# Patient Record
Sex: Female | Born: 1950 | Race: White | Hispanic: No | Marital: Married | Smoking: Never smoker
Health system: Southern US, Community
[De-identification: ages and names within clinical notes are randomized; demographics above are authoritative.]

## PROBLEM LIST (undated history)

## (undated) DIAGNOSIS — G4730 Sleep apnea, unspecified: Secondary | ICD-10-CM

## (undated) DIAGNOSIS — I10 Essential (primary) hypertension: Secondary | ICD-10-CM

## (undated) DIAGNOSIS — Z87442 Personal history of urinary calculi: Secondary | ICD-10-CM

## (undated) DIAGNOSIS — R011 Cardiac murmur, unspecified: Secondary | ICD-10-CM

## (undated) DIAGNOSIS — E119 Type 2 diabetes mellitus without complications: Secondary | ICD-10-CM

## (undated) DIAGNOSIS — N289 Disorder of kidney and ureter, unspecified: Secondary | ICD-10-CM

## (undated) HISTORY — PX: MENISCUS REPAIR: SHX5179

## (undated) HISTORY — PX: COLONOSCOPY: SHX174

## (undated) HISTORY — PX: BACK SURGERY: SHX140

---

## 2010-06-20 ENCOUNTER — Emergency Department (HOSPITAL_COMMUNITY)
Admission: EM | Admit: 2010-06-20 | Discharge: 2010-06-20 | Discharge status: Against Medical Advice | Payer: BC Managed Care – PPO | Attending: Emergency Medicine | Admitting: Emergency Medicine

## 2010-06-20 DIAGNOSIS — M79609 Pain in unspecified limb: Secondary | ICD-10-CM | POA: Insufficient documentation

## 2010-06-20 DIAGNOSIS — I10 Essential (primary) hypertension: Secondary | ICD-10-CM | POA: Insufficient documentation

## 2010-06-20 DIAGNOSIS — M545 Low back pain, unspecified: Secondary | ICD-10-CM | POA: Insufficient documentation

## 2010-06-20 DIAGNOSIS — Z79899 Other long term (current) drug therapy: Secondary | ICD-10-CM | POA: Insufficient documentation

## 2016-04-07 ENCOUNTER — Encounter (HOSPITAL_COMMUNITY): Payer: Self-pay | Admitting: Emergency Medicine

## 2016-04-07 ENCOUNTER — Inpatient Hospital Stay (HOSPITAL_COMMUNITY)
Admission: EM | Admit: 2016-04-07 | Discharge: 2016-04-10 | DRG: 872 | Disposition: A | Discharge status: Home / Self Care | Payer: BLUE CROSS/BLUE SHIELD | Attending: Urology | Admitting: Urology

## 2016-04-07 DIAGNOSIS — N10 Acute tubulo-interstitial nephritis: Secondary | ICD-10-CM

## 2016-04-07 DIAGNOSIS — I10 Essential (primary) hypertension: Secondary | ICD-10-CM | POA: Diagnosis present

## 2016-04-07 DIAGNOSIS — N136 Pyonephrosis: Secondary | ICD-10-CM | POA: Diagnosis present

## 2016-04-07 DIAGNOSIS — E119 Type 2 diabetes mellitus without complications: Secondary | ICD-10-CM | POA: Diagnosis present

## 2016-04-07 DIAGNOSIS — N201 Calculus of ureter: Secondary | ICD-10-CM | POA: Diagnosis not present

## 2016-04-07 DIAGNOSIS — Z7982 Long term (current) use of aspirin: Secondary | ICD-10-CM

## 2016-04-07 DIAGNOSIS — A419 Sepsis, unspecified organism: Secondary | ICD-10-CM | POA: Diagnosis not present

## 2016-04-07 DIAGNOSIS — Z7984 Long term (current) use of oral hypoglycemic drugs: Secondary | ICD-10-CM

## 2016-04-07 DIAGNOSIS — Z888 Allergy status to other drugs, medicaments and biological substances status: Secondary | ICD-10-CM

## 2016-04-07 HISTORY — DX: Disorder of kidney and ureter, unspecified: N28.9

## 2016-04-07 HISTORY — DX: Essential (primary) hypertension: I10

## 2016-04-07 HISTORY — DX: Type 2 diabetes mellitus without complications: E11.9

## 2016-04-07 LAB — URINALYSIS, ROUTINE W REFLEX MICROSCOPIC
Bilirubin Urine: NEGATIVE
GLUCOSE, UA: NEGATIVE mg/dL
KETONES UR: NEGATIVE mg/dL
NITRITE: POSITIVE — AB
Protein, ur: 30 mg/dL — AB
Specific Gravity, Urine: 1.012 (ref 1.005–1.030)
pH: 5 (ref 5.0–8.0)

## 2016-04-07 MED ORDER — SODIUM CHLORIDE 0.9 % IV BOLUS (SEPSIS)
1000.0000 mL | Freq: Once | INTRAVENOUS | Status: AC
  Administered 2016-04-07: 1000 mL via INTRAVENOUS

## 2016-04-07 MED ORDER — DEXTROSE 5 % IV SOLN
1.0000 g | Freq: Once | INTRAVENOUS | Status: AC
  Administered 2016-04-08: 1 g via INTRAVENOUS
  Filled 2016-04-07: qty 10

## 2016-04-07 MED ORDER — FENTANYL CITRATE (PF) 100 MCG/2ML IJ SOLN
50.0000 ug | Freq: Once | INTRAMUSCULAR | Status: AC
  Administered 2016-04-07: 50 ug via INTRAVENOUS
  Filled 2016-04-07: qty 2

## 2016-04-07 MED ORDER — ONDANSETRON HCL 4 MG/2ML IJ SOLN
4.0000 mg | Freq: Once | INTRAMUSCULAR | Status: AC
  Administered 2016-04-07: 4 mg via INTRAVENOUS
  Filled 2016-04-07: qty 2

## 2016-04-07 NOTE — ED Triage Notes (Signed)
Pt states that she has been having left flank pain since yesterday with chills and nausea.  Has a history of kidney stones

## 2016-04-07 NOTE — ED Provider Notes (Signed)
[facility]-EMER[facility]ENCY DEPT Provider Note   CSN: [id number] Arrival date & time: 04/07/16  1903 By signing my name below, I, Levon Hedger, attest that this documentation has been prepared under the direction and in the presence of Zadie Rhine, MD . Electronically Signed: Levon Hedger, Scribe. 04/07/2016. 11:27 PM.   History   Chief Complaint Chief Complaint  Patient presents with  . Flank Pain   HPI Isabella Stewart is a 66 y.o. female with a history of kidney stones, HTN and DM who presents to the Emergency Department complaining of constant, gradually worsening left lateral flank pain that does not radiate onset yesterday. She reports associated nausea and chills. Pain is exacerbated by direct palpation and with movement, and is alleviated by resting. Before symptoms began pt was otherwise in her normal health. Per pt, she was seen at [facility] by her PCP in January 2018 and was told that her kidney function had begun to change. Pt denies any fevers, vomiting, CP, SOB, diarrhea, extremity weakness or numbness, or any other associated symptoms.   The history is provided by the patient. No language interpreter was used.    Past Medical History:  Diagnosis Date  . Diabetes mellitus without complication (HCC)   . Hypertension   . Renal disorder    kidney stones    There are no active problems to display for this patient.   Past Surgical History:  Procedure Laterality Date  . BACK SURGERY      OB History    No data available       Home Medications    Prior to Admission medications   Medication Sig Start Date End Date Taking? Authorizing Provider  aspirin EC 81 MG tablet Take 81 mg by mouth daily.   Yes Historical Provider, MD  atorvastatin (LIPITOR) 10 MG tablet Take 10 mg by mouth at bedtime.   Yes Historical Provider, MD  chlorthalidone (HYGROTON) 25 MG tablet Take 25 mg by mouth daily.   Yes Historical Provider, MD  ergocalciferol (VITAMIN D2) 50000 units capsule Take 50,000  Units by mouth every Wednesday.   Yes Historical Provider, MD  fosinopril (MONOPRIL) 40 MG tablet Take 40 mg by mouth daily.   Yes Historical Provider, MD  glipiZIDE (GLUCOTROL XL) 10 MG 24 hr tablet Take 10 mg by mouth daily with breakfast.   Yes Historical Provider, MD  metFORMIN (GLUCOPHAGE) 500 MG tablet Take 500 mg by mouth 2 (two) times daily with a meal.   Yes Historical Provider, MD  multivitamin-lutein (OCUVITE-LUTEIN) CAPS capsule Take 1 capsule by mouth daily.   Yes Historical Provider, MD  pioglitazone (ACTOS) 30 MG tablet Take 30 mg by mouth daily.   Yes Historical Provider, MD    Family History No family history on file.  Social History Social History  Substance Use Topics  . Smoking status: Never Smoker  . Smokeless tobacco: Never Used  . Alcohol use No   Allergies   Premarin [conjugated estrogens]   Review of Systems Review of Systems  Constitutional: Positive for chills. Negative for fever.  Respiratory: Negative for shortness of breath.   Cardiovascular: Negative for chest pain.  Gastrointestinal: Positive for nausea. Negative for diarrhea and vomiting.  Genitourinary: Positive for flank pain.  Neurological: Negative for weakness and numbness.  All other systems reviewed and are negative.    Physical Exam Updated Vital Signs BP 135/72 (BP Location: Left Arm)   Pulse (!) 122   Temp 99.9 F (37.7 C) (Oral)   Resp 18  Ht 5\' 2"  (1.575 m)   Wt 180 lb (81.6 kg)   SpO2 100%   BMI 32.92 kg/m   Physical Exam CONSTITUTIONAL: Well developed/well nourished HEAD: Normocephalic/atraumatic EYES: EOMI/PERRL ENMT: Mucous membranes moist NECK: supple no meningeal signs SPINE/BACK:entire spine nontender CV: S1/S2 noted, no murmurs/rubs/gallops noted LUNGS: Lungs are clear to auscultation bilaterally, no apparent distress ABDOMEN: soft, nontender, no rebound or guarding, bowel sounds noted throughout abdomen ZO:XWRU cva tenderness NEURO: Pt is  awake/alert/appropriate, moves all extremitiesx4.  No facial droop.   EXTREMITIES: pulses normal/equal, full ROM SKIN: warm, color normal PSYCH: no abnormalities of mood noted, alert and oriented to situation   ED Treatments / Results  DIAGNOSTIC STUDIES:  Oxygen Saturation is 100% on RA, normal by my interpretation.    COORDINATION OF CARE:  11:18 PM Will order urine culture and CT renal stone study. Discussed treatment plan which includes Zofran and fentanyl with pt at bedside and pt agreed to plan.  Labs (all labs ordered are listed, but only abnormal results are displayed) Labs Reviewed  URINALYSIS, ROUTINE W REFLEX MICROSCOPIC - Abnormal; Notable for the following:       Result Value   APPearance HAZY (*)    Hgb urine dipstick LARGE (*)    Protein, ur 30 (*)    Nitrite POSITIVE (*)    Leukocytes, UA LARGE (*)    Bacteria, UA RARE (*)    Non Squamous Epithelial 0-5 (*)    All other components within normal limits  CBC WITH DIFFERENTIAL/PLATELET - Abnormal; Notable for the following:    RBC 3.62 (*)    Hemoglobin 10.8 (*)    HCT 32.1 (*)    All other components within normal limits  BASIC METABOLIC PANEL - Abnormal; Notable for the following:    Sodium 131 (*)    Chloride 95 (*)    Glucose, Bld 213 (*)    All other components within normal limits  URINE CULTURE    EKG  EKG Interpretation None       Radiology Ct Renal Stone Study  Result Date: 04/08/2016 CLINICAL DATA:  66 year old female with left flank pain. EXAM: CT ABDOMEN AND PELVIS WITHOUT CONTRAST TECHNIQUE: Multidetector CT imaging of the abdomen and pelvis was performed following the standard protocol without IV contrast. COMPARISON:  None. FINDINGS: Evaluation of this exam is limited in the absence of intravenous contrast. Lower chest: The visualized lung bases are clear. No intra-abdominal free air or free fluid. Hepatobiliary: No focal liver abnormality is seen. No gallstones, gallbladder wall  thickening, or biliary dilatation. Pancreas: Unremarkable. No pancreatic ductal dilatation or surrounding inflammatory changes. Spleen: Normal in size without focal abnormality. Adrenals/Urinary Tract: The adrenal glands are unremarkable. There is a 5 mm distal left ureteral stone with mild left hydronephrosis. Correlation with urinalysis recommended to exclude superimposed UTI. The right kidney, right ureter, and urinary bladder appear unremarkable. Stomach/Bowel: There is sigmoid diverticulosis without active inflammatory changes. Constipation. No bowel obstruction or active inflammation. Normal appendix. A 12 mm distal duodenal diverticulum noted. Vascular/Lymphatic: Mild aortoiliac atherosclerotic disease. No aneurysmal dilatation. The IVC is grossly unremarkable. No portal venous gas identified. Evaluation of the vasculature is limited in the absence of intravenous contrast. There is no adenopathy. Reproductive: The uterus and ovaries are grossly unremarkable. Other: None Musculoskeletal: Degenerative changes of the spine. L4-L5 disc desiccation with vacuum phenomenon. L4-L5 facet arthropathy and vacuum phenomena. No acute fracture. IMPRESSION: 1. A 5 mm distal left ureteral stone with mild left hydronephrosis. 2. Constipation. No  bowel obstruction or active inflammation. Normal appendix. 3. Sigmoid diverticulosis. Electronically Signed   By: Elgie CollardArash  Radparvar M.D.   On: 04/08/2016 01:35    Procedures Procedures  Medications Ordered in ED Medications  ondansetron [facility](ZOFRAN) injection 4 mg (4 mg Intravenous Given 04/07/16 2343)  fentaNYL (SUBLIMAZE) injection 50 mcg (50 mcg Intravenous Given 04/07/16 2342)  sodium chloride 0.9 % bolus 1,000 mL (0 mLs Intravenous Stopped 04/08/16 0209)  cefTRIAXone (ROCEPHIN) 1 g in dextrose 5 % 50 mL IVPB (0 g Intravenous Stopped 04/08/16 0209)     Initial Impression / Assessment and Plan / ED Course  I have reviewed the triage vital signs and the nursing  notes.  Pertinent labs  results that were available during my care of the patient were reviewed by me and considered in my medical decision making (see chart for details).     2:47 AM Pt noted to have left ureteral stone with associated UTI D/w dr urology dr Ronne Binningmckenzie [facility] and consult him when she arrives Pt agreeable She has been given IV antibiotics I have informed Elpidio AnisShari Upstill PA  To inform Dr Devoria AlbeIva Knapp about patient transfer  BP 111/70 (BP Location: Left Arm)   Pulse 91   Temp 98.6 F (37 C) (Oral)   Resp 15   Ht 5\' 2"  (1.575 m)   Wt 81.6 kg   SpO2 96%   BMI 32.92 kg/m   Final Clinical Impressions(s) / ED Diagnoses   Final diagnoses:  Acute pyelonephritis  Ureteral stone    New Prescriptions New Prescriptions   No medications on file   I personally performed the services described in this documentation, which was scribed in my presence. The recorded information has been reviewed and is accurate.        Zadie Rhineonald Britten Parady, MD 04/08/16 [phone]

## 2016-04-08 ENCOUNTER — Observation Stay (HOSPITAL_COMMUNITY): Payer: BLUE CROSS/BLUE SHIELD

## 2016-04-08 ENCOUNTER — Emergency Department (HOSPITAL_COMMUNITY): Payer: BLUE CROSS/BLUE SHIELD | Admitting: Certified Registered"

## 2016-04-08 ENCOUNTER — Encounter (HOSPITAL_COMMUNITY): Payer: Self-pay | Admitting: Certified Registered"

## 2016-04-08 ENCOUNTER — Emergency Department (HOSPITAL_COMMUNITY): Payer: BLUE CROSS/BLUE SHIELD

## 2016-04-08 ENCOUNTER — Encounter (HOSPITAL_COMMUNITY)
Admission: EM | Disposition: A | Discharge status: Home / Self Care | Payer: Self-pay | Source: Home / Self Care | Attending: Urology

## 2016-04-08 DIAGNOSIS — N136 Pyonephrosis: Secondary | ICD-10-CM | POA: Diagnosis not present

## 2016-04-08 DIAGNOSIS — N201 Calculus of ureter: Secondary | ICD-10-CM | POA: Diagnosis present

## 2016-04-08 DIAGNOSIS — I10 Essential (primary) hypertension: Secondary | ICD-10-CM | POA: Diagnosis not present

## 2016-04-08 DIAGNOSIS — A419 Sepsis, unspecified organism: Secondary | ICD-10-CM | POA: Diagnosis not present

## 2016-04-08 DIAGNOSIS — Z7982 Long term (current) use of aspirin: Secondary | ICD-10-CM | POA: Diagnosis not present

## 2016-04-08 DIAGNOSIS — Z7984 Long term (current) use of oral hypoglycemic drugs: Secondary | ICD-10-CM | POA: Diagnosis not present

## 2016-04-08 DIAGNOSIS — E119 Type 2 diabetes mellitus without complications: Secondary | ICD-10-CM | POA: Diagnosis not present

## 2016-04-08 DIAGNOSIS — Z888 Allergy status to other drugs, medicaments and biological substances status: Secondary | ICD-10-CM | POA: Diagnosis not present

## 2016-04-08 HISTORY — PX: CYSTOSCOPY W/ URETERAL STENT PLACEMENT: SHX1429

## 2016-04-08 LAB — CBC WITH DIFFERENTIAL/PLATELET
BASOS ABS: 0 10*3/uL (ref 0.0–0.1)
BASOS PCT: 0 %
Eosinophils Absolute: 0 10*3/uL (ref 0.0–0.7)
Eosinophils Relative: 0 %
HEMATOCRIT: 32.1 % — AB (ref 36.0–46.0)
HEMOGLOBIN: 10.8 g/dL — AB (ref 12.0–15.0)
LYMPHS PCT: 11 %
Lymphs Abs: 1.1 10*3/uL (ref 0.7–4.0)
MCH: 29.8 pg (ref 26.0–34.0)
MCHC: 33.6 g/dL (ref 30.0–36.0)
MCV: 88.7 fL (ref 78.0–100.0)
MONO ABS: 0.9 10*3/uL (ref 0.1–1.0)
Monocytes Relative: 9 %
NEUTROS ABS: 7.6 10*3/uL (ref 1.7–7.7)
NEUTROS PCT: 80 %
Platelets: 229 10*3/uL (ref 150–400)
RBC: 3.62 MIL/uL — AB (ref 3.87–5.11)
RDW: 14.7 % (ref 11.5–15.5)
WBC: 9.6 10*3/uL (ref 4.0–10.5)

## 2016-04-08 LAB — BASIC METABOLIC PANEL
ANION GAP: 8 (ref 5–15)
ANION GAP: 9 (ref 5–15)
BUN: 13 mg/dL (ref 6–20)
BUN: 13 mg/dL (ref 6–20)
CALCIUM: 9.4 mg/dL (ref 8.9–10.3)
CO2: 29 mmol/L (ref 22–32)
CO2: 27 mmol/L (ref 22–32)
Calcium: 8.8 mg/dL — ABNORMAL LOW (ref 8.9–10.3)
Chloride: 95 mmol/L — ABNORMAL LOW (ref 101–111)
Chloride: 98 mmol/L — ABNORMAL LOW (ref 101–111)
Creatinine, Ser: 0.83 mg/dL (ref 0.44–1.00)
Creatinine, Ser: 0.92 mg/dL (ref 0.44–1.00)
GFR calc Af Amer: >60 mL/min (ref >60)
GFR calc non Af Amer: >60 mL/min (ref >60)
GLUCOSE: 158 mg/dL — AB (ref 65–99)
GLUCOSE: 213 mg/dL — AB (ref 65–99)
POTASSIUM: 3.6 mmol/L (ref 3.5–5.1)
Potassium: 3.7 mmol/L (ref 3.5–5.1)
Sodium: 131 mmol/L — ABNORMAL LOW (ref 135–145)
Sodium: 135 mmol/L (ref 135–145)

## 2016-04-08 LAB — GLUCOSE, CAPILLARY
GLUCOSE-CAPILLARY: 127 mg/dL — AB (ref 65–99)
GLUCOSE-CAPILLARY: 157 mg/dL — AB (ref 65–99)
GLUCOSE-CAPILLARY: 109 mg/dL — AB (ref 65–99)
Glucose-Capillary: 150 mg/dL — ABNORMAL HIGH (ref 65–99)
Glucose-Capillary: 136 mg/dL — ABNORMAL HIGH (ref 65–99)

## 2016-04-08 LAB — CBC
HEMATOCRIT: 27.5 % — AB (ref 36.0–46.0)
Hemoglobin: 9.1 g/dL — ABNORMAL LOW (ref 12.0–15.0)
MCH: 28.8 pg (ref 26.0–34.0)
MCHC: 33.1 g/dL (ref 30.0–36.0)
MCV: 87 fL (ref 78.0–100.0)
PLATELETS: 183 10*3/uL (ref 150–400)
RBC: 3.16 MIL/uL — ABNORMAL LOW (ref 3.87–5.11)
RDW: 15 % (ref 11.5–15.5)
WBC: 7.1 10*3/uL (ref 4.0–10.5)

## 2016-04-08 SURGERY — CYSTOSCOPY, WITH RETROGRADE PYELOGRAM AND URETERAL STENT INSERTION
Anesthesia: General | Site: Ureter | Laterality: Left

## 2016-04-08 MED ORDER — FENTANYL CITRATE (PF) 250 MCG/5ML IJ SOLN
INTRAMUSCULAR | Status: DC | PRN
  Administered 2016-04-08: 50 ug via INTRAVENOUS

## 2016-04-08 MED ORDER — DEXTROSE 5 % IV SOLN
1.0000 g | Freq: Once | INTRAVENOUS | Status: AC
  Administered 2016-04-08: 1 g via INTRAVENOUS
  Filled 2016-04-08: qty 10

## 2016-04-08 MED ORDER — ONDANSETRON HCL 4 MG/2ML IJ SOLN
INTRAMUSCULAR | Status: DC | PRN
  Administered 2016-04-08: 4 mg via INTRAVENOUS

## 2016-04-08 MED ORDER — ASPIRIN EC 81 MG PO TBEC
81.0000 mg | DELAYED_RELEASE_TABLET | Freq: Every day | ORAL | Status: DC
  Administered 2016-04-08 – 2016-04-10 (×3): 81 mg via ORAL
  Filled 2016-04-08 (×3): qty 1

## 2016-04-08 MED ORDER — PHENYLEPHRINE HCL 10 MG/ML IJ SOLN
INTRAMUSCULAR | Status: DC | PRN
  Administered 2016-04-08: 80 ug via INTRAVENOUS

## 2016-04-08 MED ORDER — ACETAMINOPHEN 325 MG PO TABS
650.0000 mg | ORAL_TABLET | ORAL | Status: DC | PRN
  Administered 2016-04-08 – 2016-04-09 (×3): 650 mg via ORAL
  Filled 2016-04-08 (×3): qty 2

## 2016-04-08 MED ORDER — OXYCODONE HCL 5 MG/5ML PO SOLN
5.0000 mg | Freq: Once | ORAL | Status: DC | PRN
  Filled 2016-04-08: qty 5

## 2016-04-08 MED ORDER — PROPOFOL 10 MG/ML IV BOLUS
INTRAVENOUS | Status: DC | PRN
  Administered 2016-04-08: 180 mg via INTRAVENOUS

## 2016-04-08 MED ORDER — FENTANYL CITRATE (PF) 100 MCG/2ML IJ SOLN
INTRAMUSCULAR | Status: AC
  Filled 2016-04-08: qty 2

## 2016-04-08 MED ORDER — CHLORTHALIDONE 25 MG PO TABS
25.0000 mg | ORAL_TABLET | Freq: Every day | ORAL | Status: DC
Start: 2016-04-08 — End: 2016-04-10
  Administered 2016-04-08 – 2016-04-10 (×3): 25 mg via ORAL
  Filled 2016-04-08 (×4): qty 1

## 2016-04-08 MED ORDER — DIPHENHYDRAMINE HCL 50 MG/ML IJ SOLN: 12.5000 mg | Freq: Four times a day (QID) | INTRAMUSCULAR | Status: DC | PRN

## 2016-04-08 MED ORDER — ZOLPIDEM TARTRATE 5 MG PO TABS: 5.0000 mg | ORAL_TABLET | Freq: Every evening | ORAL | Status: DC | PRN

## 2016-04-08 MED ORDER — DEXTROSE 5 % IV SOLN
2.0000 g | INTRAVENOUS | Status: DC
  Administered 2016-04-09 – 2016-04-10 (×2): 2 g via INTRAVENOUS
  Filled 2016-04-08 (×2): qty 2

## 2016-04-08 MED ORDER — FENTANYL CITRATE (PF) 100 MCG/2ML IJ SOLN
25.0000 ug | INTRAMUSCULAR | Status: DC | PRN
  Administered 2016-04-08: 50 ug via INTRAVENOUS

## 2016-04-08 MED ORDER — HYDROMORPHONE HCL 1 MG/ML IJ SOLN
0.5000 mg | INTRAMUSCULAR | Status: DC | PRN
  Administered 2016-04-08 (×2): 0.5 mg via INTRAVENOUS
  Administered 2016-04-08: 1 mg via INTRAVENOUS
  Administered 2016-04-08: 0.5 mg via INTRAVENOUS
  Administered 2016-04-09 (×2): 1 mg via INTRAVENOUS
  Administered 2016-04-09 – 2016-04-10 (×4): 0.5 mg via INTRAVENOUS
  Filled 2016-04-08 (×3): qty 0.5
  Filled 2016-04-08: qty 1
  Filled 2016-04-08 (×3): qty 0.5
  Filled 2016-04-08 (×2): qty 1
  Filled 2016-04-08: qty 0.5
  Filled 2016-04-08: qty 1

## 2016-04-08 MED ORDER — MIDAZOLAM HCL 2 MG/2ML IJ SOLN
INTRAMUSCULAR | Status: AC
Start: 2016-04-08 — End: 2016-04-08
  Filled 2016-04-08: qty 2

## 2016-04-08 MED ORDER — FENTANYL CITRATE (PF) 100 MCG/2ML IJ SOLN
50.0000 ug | Freq: Once | INTRAMUSCULAR | Status: AC
  Administered 2016-04-08: 50 ug via INTRAVENOUS
  Filled 2016-04-08: qty 2

## 2016-04-08 MED ORDER — DOCUSATE SODIUM 100 MG PO CAPS
100.0000 mg | ORAL_CAPSULE | Freq: Two times a day (BID) | ORAL | Status: DC
  Administered 2016-04-08 – 2016-04-10 (×5): 100 mg via ORAL
  Filled 2016-04-08 (×5): qty 1

## 2016-04-08 MED ORDER — DIPHENHYDRAMINE HCL 12.5 MG/5ML PO ELIX: 12.5000 mg | ORAL_SOLUTION | Freq: Four times a day (QID) | ORAL | Status: DC | PRN

## 2016-04-08 MED ORDER — LACTATED RINGERS IV SOLN: INTRAVENOUS | Status: DC

## 2016-04-08 MED ORDER — ONDANSETRON HCL 4 MG/2ML IJ SOLN: 4.0000 mg | Freq: Four times a day (QID) | INTRAMUSCULAR | Status: DC | PRN

## 2016-04-08 MED ORDER — INSULIN ASPART 100 UNIT/ML ~~LOC~~ SOLN
0.0000 [IU] | Freq: Three times a day (TID) | SUBCUTANEOUS | Status: DC
  Administered 2016-04-08 (×2): 2 [IU] via SUBCUTANEOUS
  Administered 2016-04-09: 3 [IU] via SUBCUTANEOUS
  Administered 2016-04-10: 2 [IU] via SUBCUTANEOUS

## 2016-04-08 MED ORDER — ONDANSETRON HCL 4 MG/2ML IJ SOLN
4.0000 mg | INTRAMUSCULAR | Status: DC | PRN
  Administered 2016-04-08 – 2016-04-09 (×2): 4 mg via INTRAVENOUS
  Filled 2016-04-08 (×2): qty 2

## 2016-04-08 MED ORDER — MIDAZOLAM HCL 2 MG/2ML IJ SOLN
INTRAMUSCULAR | Status: DC | PRN
  Administered 2016-04-08: 2 mg via INTRAVENOUS

## 2016-04-08 MED ORDER — LACTATED RINGERS IV SOLN
INTRAVENOUS | Status: DC | PRN
  Administered 2016-04-08: 06:00:00 via INTRAVENOUS

## 2016-04-08 MED ORDER — SODIUM CHLORIDE 0.9 % IV SOLN
INTRAVENOUS | Status: DC | PRN
  Administered 2016-04-08: 07:00:00

## 2016-04-08 MED ORDER — ATORVASTATIN CALCIUM 10 MG PO TABS
10.0000 mg | ORAL_TABLET | Freq: Every day | ORAL | Status: DC
  Administered 2016-04-08 – 2016-04-09 (×2): 10 mg via ORAL
  Filled 2016-04-08 (×2): qty 1

## 2016-04-08 MED ORDER — OXYCODONE-ACETAMINOPHEN 5-325 MG PO TABS: 1.0000 | ORAL_TABLET | ORAL | Status: DC | PRN

## 2016-04-08 MED ORDER — OXYCODONE HCL 5 MG PO TABS: 5.0000 mg | ORAL_TABLET | Freq: Once | ORAL | Status: DC | PRN

## 2016-04-08 MED ORDER — LIDOCAINE 2% (20 MG/ML) 5 ML SYRINGE
INTRAMUSCULAR | Status: DC | PRN
  Administered 2016-04-08: 100 mg via INTRAVENOUS

## 2016-04-08 MED ORDER — SODIUM CHLORIDE 0.9 % IV SOLN
INTRAVENOUS | Status: DC
  Administered 2016-04-08 – 2016-04-09 (×4): via INTRAVENOUS

## 2016-04-08 MED ORDER — PROPOFOL 10 MG/ML IV BOLUS
INTRAVENOUS | Status: AC
  Filled 2016-04-08: qty 20

## 2016-04-08 SURGICAL SUPPLY — 25 items
BAG URO CATCHER STRL LF (MISCELLANEOUS) ×3 IMPLANT
BASKET DAKOTA 1.9FR 11X120 (BASKET) IMPLANT
BASKET LASER NITINOL 1.9FR (BASKET) IMPLANT
CATH FOLEY LATEX FREE 20FR (CATHETERS)
CATH FOLEY LF 20FR (CATHETERS) IMPLANT
CATH INTERMIT  6FR 70CM (CATHETERS) ×3 IMPLANT
CLOTH BEACON ORANGE TIMEOUT ST (SAFETY) ×3 IMPLANT
EXTRACTOR STONE NITINOL NGAGE (UROLOGICAL SUPPLIES) IMPLANT
FIBER LASER TRAC TIP (UROLOGICAL SUPPLIES) IMPLANT
GLOVE BIO SURGEON STRL SZ8 (GLOVE) ×3 IMPLANT
GOWN STRL REUS W/TWL XL LVL3 (GOWN DISPOSABLE) ×3 IMPLANT
GUIDEWIRE ANG ZIPWIRE 038X150 (WIRE) ×3 IMPLANT
GUIDEWIRE STR DUAL SENSOR (WIRE) ×3 IMPLANT
IV NS 1000ML (IV SOLUTION) ×2
IV NS 1000ML BAXH (IV SOLUTION) ×1 IMPLANT
MANIFOLD NEPTUNE II (INSTRUMENTS) ×3 IMPLANT
PACK CYSTO (CUSTOM PROCEDURE TRAY) ×3 IMPLANT
SHEATH ACCESS URETERAL 38CM (SHEATH) IMPLANT
STENT CONTOUR 6FRX26X.038 (STENTS) IMPLANT
STENT URET 6FRX24 CONTOUR (STENTS) ×3 IMPLANT
SYR CONTROL 10ML LL (SYRINGE) IMPLANT
SYRINGE IRR TOOMEY STRL 70CC (SYRINGE) IMPLANT
TUBE FEEDING 8FR 16IN STR KANG (MISCELLANEOUS) IMPLANT
TUBING CONNECTING 10 (TUBING) ×2 IMPLANT
TUBING CONNECTING 10' (TUBING) ×1

## 2016-04-08 NOTE — Progress Notes (Signed)
CBC and B met drawn by lab. 

## 2016-04-08 NOTE — ED Notes (Signed)
Called OR [facility] [phone]

## 2016-04-08 NOTE — H&P (Signed)
Urology Admission H&P  Chief Complaint: left flank pain  History of Present Illness: Ms Isabella Stewart is a 66yo with a hx of nephrolithiasis and DMII who presented to [facility] ER with a 1 day hx of svere sharp, constant, nonraditiang left flank pain. She was having chills at home. She has associated nausea and vomiting. She has 1-2 stone events a year. She had a septic stone event in 1999. WBC count was normal. Ct scan showed a 5mm left proximal ureteral calculus with mild hydronephrosis and perinephric stranding. She had a fever of 100F in the ER.   Past Medical History:  Diagnosis Date  . Diabetes mellitus without complication (HCC)   . Hypertension   . Renal disorder    kidney stones   Past Surgical History:  Procedure Laterality Date  . BACK SURGERY      Home Medications:   (Not in a hospital admission) Allergies:  Allergies  Allergen Reactions  . Premarin [Conjugated Estrogens] Rash    History reviewed. No pertinent family history. Social History:  reports that she has never smoked. She has never used smokeless tobacco. She reports that she does not drink alcohol or use drugs.  Review of Systems  Constitutional: Positive for chills and fever.  Gastrointestinal: Positive for nausea and vomiting.  Genitourinary: Positive for dysuria, flank pain, frequency, hematuria and urgency.  All other systems reviewed and are negative.   Physical Exam:  Vital signs in last 24 hours: Temp:  [98.6 F (37 C)-99.9 F (37.7 C)] 98.6 F (37 C) (03/23 0349) Pulse Rate:  [91-122] 98 (03/23 0349) Resp:  [15-18] 16 (03/23 0349) BP: (105-135)/(62-72) 105/62 (03/23 0349) SpO2:  [96 %-100 %] 97 % (03/23 0434) Weight:  [81.6 kg (180 lb)] 81.6 kg (180 lb) (03/22 1934) Physical Exam  Constitutional: She is oriented to person, place, and time. She appears well-developed and well-nourished.  HENT:  Head: Normocephalic and atraumatic.  Eyes: EOM are normal. Pupils are equal, round, and reactive to  light.  Neck: Normal range of motion. No thyromegaly present.  Cardiovascular: Normal rate and regular rhythm.   Respiratory: Effort normal. No respiratory distress.  GI: Soft. She exhibits no distension.  Musculoskeletal: Normal range of motion. She exhibits no edema.  Neurological: She is alert and oriented to person, place, and time.  Skin: Skin is warm and dry.  Psychiatric: She has a normal mood and affect. Her behavior is normal. Judgment and thought content normal.    Laboratory Data:  Results for orders placed or performed during the hospital encounter of 04/07/16 (from the past 24 hour(s))  Urinalysis, Routine w reflex microscopic     Status: Abnormal   Collection Time: 04/07/16 11:06 PM  Result Value Ref Range   Color, Urine YELLOW YELLOW   APPearance HAZY (A) CLEAR   Specific Gravity, Urine 1.012 1.005 - 1.030   pH 5.0 5.0 - 8.0   Glucose, UA NEGATIVE NEGATIVE mg/dL   Hgb urine dipstick LARGE (A) NEGATIVE   Bilirubin Urine NEGATIVE NEGATIVE   Ketones, ur NEGATIVE NEGATIVE mg/dL   Protein, ur 30 (A) NEGATIVE mg/dL   Nitrite POSITIVE (A) NEGATIVE   Leukocytes, UA LARGE (A) NEGATIVE   RBC / HPF TOO NUMEROUS TO COUNT 0 - 5 RBC/hpf   WBC, UA TOO NUMEROUS TO COUNT 0 - 5 WBC/hpf   Bacteria, UA RARE (A) NONE SEEN   Mucous PRESENT    Non Squamous Epithelial 0-5 (A) NONE SEEN  CBC with Differential/Platelet     Status:  Abnormal   Collection Time: 04/07/16 11:34 PM  Result Value Ref Range   WBC 9.6 4.0 - 10.5 K/uL   RBC 3.62 (L) 3.87 - 5.11 MIL/uL   Hemoglobin 10.8 (L) 12.0 - 15.0 g/dL   HCT 32.1 (L) 36.0 - 46.0 %   MCV 88.7 78.0 - 100.0 fL   MCH 29.8 26.0 - 34.0 pg   MCHC 33.6 30.0 - 36.0 g/dL   RDW 14.7 11.5 - 15.5 %   Platelets 229 150 - 400 K/uL   Neutrophils Relative % 80 %   Neutro Abs 7.6 1.7 - 7.7 K/uL   Lymphocytes Relative 11 %   Lymphs Abs 1.1 0.7 - 4.0 K/uL   Monocytes Relative 9 %   Monocytes Absolute 0.9 0.1 - 1.0 K/uL   Eosinophils Relative 0 %    Eosinophils Absolute 0.0 0.0 - 0.7 K/uL   Basophils Relative 0 %   Basophils Absolute 0.0 0.0 - 0.1 K/uL  Basic metabolic panel     Status: Abnormal   Collection Time: 04/07/16 11:34 PM  Result Value Ref Range   Sodium 131 (L) 135 - 145 mmol/L   Potassium 3.6 3.5 - 5.1 mmol/L   Chloride 95 (L) 101 - 111 mmol/L   CO2 27 22 - 32 mmol/L   Glucose, Bld 213 (H) 65 - 99 mg/dL   BUN 13 6 - 20 mg/dL   Creatinine, Ser 0.83 0.44 - 1.00 mg/dL   Calcium 9.4 8.9 - 10.3 mg/dL   GFR calc non Af Amer >60 >60 mL/min   GFR calc Af Amer >60 >60 mL/min   Anion gap 9 5 - 15   No results found for this or any previous visit (from the past 240 hour(s)). Creatinine:  Recent Labs  04/07/16 2334  CREATININE 0.83   Baseline Creatinine: 0.8  Impression/Assessment:  66yo with left ureteral calculus and sepsis  Plan:  1. The risks/benefits/alternatives to left ureteral stent placement was explained to the patient and she understands and wishes to proceed with surgery 2. She will be taken urgently to the OR for stent placement and then will be admitted for IV antibiotics post op.  Wildon Cuevas 04/08/2016, 5:51 AM      

## 2016-04-08 NOTE — Op Note (Signed)
.  Preoperative diagnosis: Left ureteral stone, sepsis  Postoperative diagnosis: Same  Procedure: 1 cystoscopy 2. Left retrograde pyelography 3.  Intraoperative fluoroscopy, under one hour, with interpretation 4. Left 6 x 26 JJ stent placement  Attending: Wilkie AyePatrick Tria Noguera  Anesthesia: General  Estimated blood loss: None  Drains: Left 6 x 24 JJ ureteral stent without tether, 16 French foley catheter  Specimens: none  Antibiotics: rocephin  Findings: left mid ureteral stone. Mild hydronephrosis. No masses/lesions in the bladder. Ureteral orifices in normal anatomic location.  Indications: Patient is a 66 year old female with a history of left renal stone and concern for sepsis.  After discussing treatment options, they decided proceed with left stent placement.  Procedure her in detail: The patient was brought to the operating room and a brief timeout was done to ensure correct patient, correct procedure, correct site.  General anesthesia was administered patient was placed in dorsal lithotomy position.  Their genitalia was then prepped and draped in usual sterile fashion.  A rigid 22 French cystoscope was passed in the urethra and the bladder.  Bladder was inspected free masses or lesions.  the ureteral orifices were in the normal orthotopic locations.  a 6 french ureteral catheter was then instilled into the left ureteral orifice.  a gentle retrograde was obtained and findings noted above.  we then placed a zip wire through the ureteral catheter and advanced up to the renal pelvis.    We then placed a 6 x 24 double-j ureteral stent over the original zip wire.  We then removed the wire and good coil was noted in the the renal pelvis under fluoroscopy and the bladder under direct vision.  A foley catheter was then placed. the bladder was then drained and this concluded the procedure which was well tolerated by patient.  Complications: None  Condition: Stable, extubated, transferred to  PACU  Plan: Patient is to be admitted for IV antibiotics. She will have his stone extraction in 2 weeks.

## 2016-04-08 NOTE — Anesthesia Postprocedure Evaluation (Addendum)
Anesthesia Post Note  Patient: Isabella Stewart  Procedure(s) Performed: Procedure(s) (LRB): CYSTOSCOPY WITH RETROGRADE PYELOGRAM/URETERAL STENT PLACEMENT (Left)  Patient location during evaluation: PACU Anesthesia Type: General Level of consciousness: sedated Pain management: pain level controlled Vital Signs Assessment: post-procedure vital signs reviewed and stable Respiratory status: spontaneous breathing and respiratory function stable Cardiovascular status: stable Anesthetic complications: no       Last Vitals:  Vitals:   04/08/16 0749 04/08/16 0800  BP:  101/67  Pulse: 85 93  Resp: 18 14  Temp:  37.8 C    Last Pain:  Vitals:   04/08/16 0749  TempSrc:   PainSc: 6                  SINGER,JAMES DANIEL

## 2016-04-08 NOTE — ED Provider Notes (Signed)
Patient arrived in transfer [facility] where she was fully evaluated and diagnosed with kidney stone in the setting of infection. Dr. Bebe ShaggyWickline spoke to urology, Dr. Ronne BinningMcKenzie, who [facility].   On arrival the patient is comfortable. VSS. She expresses no needs.  Discussed with Dr. Ronne BinningMcKenzie who will see the patient as soon as possible.      Elpidio AnisShari Allen Basista, PA-C 04/08/16 [phone]    Devoria AlbeIva Knapp, MD 04/08/16 [phone]

## 2016-04-08 NOTE — Care Management Note (Signed)
Case Management Note  Patient Details  Name: Isabella Stewart MRN: [id number] Date of Birth: [birth year]  Subjective/Objective:  Pt admitted with Ureteral calculus                  Action/Plan: Plan to discharge home with no needs at present time.    Expected Discharge Date:    04/11/16              Expected Discharge Plan:  Home/Self Care  In-House Referral:  NA  Discharge planning Services  CM Consult  Post Acute Care Choice:  NA Choice offered to:  NA  DME Arranged:  N/A DME Agency:  NA  HH Arranged:  NA HH Agency:  NA  Status of Service:  In process, will continue to follow  If discussed at Long Length of Stay Meetings, dates discussed:    Additional CommentsGeni Bers:  [name], RN 04/08/2016, 3:53 PM

## 2016-04-08 NOTE — ED Notes (Signed)
Notified paul OR pt is a type 2 diabetic and last meal was breakfast.

## 2016-04-08 NOTE — Transfer of Care (Signed)
Immediate Anesthesia Transfer of Care Note  Patient: Isabella Stewart  Procedure(s) Performed: Procedure(s): CYSTOSCOPY WITH RETROGRADE PYELOGRAM/URETERAL STENT PLACEMENT (Left)  Patient Location: PACU  Anesthesia Type:General  Level of Consciousness: awake, alert , oriented and patient cooperative  Airway & Oxygen Therapy: Patient Spontanous Breathing and Patient connected to face mask oxygen  Post-op Assessment: Report given to RN, Post -op Vital signs reviewed and stable and Patient moving all extremities  Post vital signs: Reviewed and stable  Last Vitals:  Vitals:   04/08/16 0234 04/08/16 0349  BP: 111/70 105/62  Pulse: 91 98  Resp: 15 16  Temp: 37 C 37 C    Last Pain:  Vitals:   04/08/16 0438  TempSrc:   PainSc: 5       Patients Stated Pain Goal: 0 (04/08/16 0323)  Complications: No apparent anesthesia complications

## 2016-04-08 NOTE — Anesthesia Procedure Notes (Signed)
Procedure Name: LMA Insertion Date/Time: 04/08/2016 7:29 AM Performed by: Jarvis NewcomerARMISTEAD, Aedin Jeansonne A Pre-anesthesia Checklist: Patient identified, Emergency Drugs available, Suction available, Patient being monitored and Timeout performed Patient Re-evaluated:Patient Re-evaluated prior to inductionOxygen Delivery Method: Circle system utilized Preoxygenation: Pre-oxygenation with 100% oxygen Intubation Type: IV induction LMA: LMA with gastric port inserted LMA Size: 4.0 Number of attempts: 1 Placement Confirmation: positive ETCO2 and breath sounds checked- equal and bilateral Tube secured with: Tape Dental Injury: Teeth and Oropharynx as per pre-operative assessment

## 2016-04-08 NOTE — Anesthesia Preprocedure Evaluation (Addendum)
Anesthesia Evaluation  Patient identified by MRN, date of birth, ID band Patient awake    Reviewed: Allergy & Precautions, H&P , NPO status , Patient's Chart, lab work & pertinent test results  Airway Mallampati: II   Neck ROM: full    Dental   Pulmonary neg pulmonary ROS,    breath sounds clear to auscultation       Cardiovascular hypertension,  Rhythm:regular Rate:Normal     Neuro/Psych    GI/Hepatic   Endo/Other  diabetes, Type 2  Renal/GU stones     Musculoskeletal   Abdominal   Peds  Hematology   Anesthesia Other Findings   Reproductive/Obstetrics                             Anesthesia Physical Anesthesia Plan  ASA: III  Anesthesia Plan: General   Post-op Pain Management:    Induction: Intravenous  Airway Management Planned: LMA  Additional Equipment:   Intra-op Plan:   Post-operative Plan:   Informed Consent: I have reviewed the patients History and Physical, chart, labs and discussed the procedure including the risks, benefits and alternatives for the proposed anesthesia with the patient or authorized representative who has indicated his/her understanding and acceptance.     Plan Discussed with: CRNA, Anesthesiologist and Surgeon  Anesthesia Plan Comments:         Anesthesia Quick Evaluation

## 2016-04-08 NOTE — Progress Notes (Signed)
[facility] Agree with previous assessment. Temp noted at 101. PRN Tylenol and Dilaudid given for fever and lower back pain. Pt resting comfortably in bed, VSS. Husband at bedside.

## 2016-04-08 NOTE — ED Notes (Signed)
[facility] Left side abd pain 5 mm stone obstructing w UTI  OR for stent placement  IV access 20  Left hand  50 fent  zofran  1liter of fluids  1 gram rocephin

## 2016-04-09 DIAGNOSIS — N201 Calculus of ureter: Secondary | ICD-10-CM | POA: Diagnosis present

## 2016-04-09 DIAGNOSIS — Z7984 Long term (current) use of oral hypoglycemic drugs: Secondary | ICD-10-CM | POA: Diagnosis not present

## 2016-04-09 DIAGNOSIS — A419 Sepsis, unspecified organism: Secondary | ICD-10-CM | POA: Diagnosis present

## 2016-04-09 DIAGNOSIS — Z7982 Long term (current) use of aspirin: Secondary | ICD-10-CM | POA: Diagnosis not present

## 2016-04-09 DIAGNOSIS — Z888 Allergy status to other drugs, medicaments and biological substances status: Secondary | ICD-10-CM | POA: Diagnosis not present

## 2016-04-09 DIAGNOSIS — N136 Pyonephrosis: Secondary | ICD-10-CM | POA: Diagnosis present

## 2016-04-09 DIAGNOSIS — I10 Essential (primary) hypertension: Secondary | ICD-10-CM | POA: Diagnosis present

## 2016-04-09 DIAGNOSIS — E119 Type 2 diabetes mellitus without complications: Secondary | ICD-10-CM | POA: Diagnosis present

## 2016-04-09 LAB — CBC
HCT: 28.4 % — ABNORMAL LOW (ref 36.0–46.0)
Hemoglobin: 9.2 g/dL — ABNORMAL LOW (ref 12.0–15.0)
MCH: 28.6 pg (ref 26.0–34.0)
MCHC: 32.4 g/dL (ref 30.0–36.0)
MCV: 88.2 fL (ref 78.0–100.0)
PLATELETS: 156 10*3/uL (ref 150–400)
RBC: 3.22 MIL/uL — ABNORMAL LOW (ref 3.87–5.11)
RDW: 15 % (ref 11.5–15.5)
WBC: 4.6 10*3/uL (ref 4.0–10.5)

## 2016-04-09 LAB — BASIC METABOLIC PANEL
Anion gap: 8 (ref 5–15)
BUN: 13 mg/dL (ref 6–20)
CO2: 28 mmol/L (ref 22–32)
Calcium: 8.6 mg/dL — ABNORMAL LOW (ref 8.9–10.3)
Chloride: 96 mmol/L — ABNORMAL LOW (ref 101–111)
Creatinine, Ser: 0.92 mg/dL (ref 0.44–1.00)
GFR calc Af Amer: >60 mL/min (ref >60)
Glucose, Bld: 101 mg/dL — ABNORMAL HIGH (ref 65–99)
POTASSIUM: 3.5 mmol/L (ref 3.5–5.1)
Sodium: 132 mmol/L — ABNORMAL LOW (ref 135–145)

## 2016-04-09 LAB — GLUCOSE, CAPILLARY
GLUCOSE-CAPILLARY: 144 mg/dL — AB (ref 65–99)
Glucose-Capillary: 102 mg/dL — ABNORMAL HIGH (ref 65–99)
Glucose-Capillary: 169 mg/dL — ABNORMAL HIGH (ref 65–99)
Glucose-Capillary: 60 mg/dL — ABNORMAL LOW (ref 65–99)
Glucose-Capillary: 130 mg/dL — ABNORMAL HIGH (ref 65–99)

## 2016-04-09 NOTE — Progress Notes (Signed)
1 Day Post-Op Subjective: Patient reports feels better. Wants foley out.   Objective: Vital signs in last 24 hours: Temp:  [98.5 F (36.9 C)-103 F (39.4 C)] 101.3 F (38.5 C) (03/24 0751) Pulse Rate:  [83-106] 101 (03/24 0751) Resp:  [16-18] 18 (03/24 0751) BP: (96-131)/(52-74) 106/52 (03/24 0751) SpO2:  [90 %-100 %] 96 % (03/24 0819)  Intake/Output from previous day: 03/23 0701 - 03/24 0700 In: 2878.7 [P.O.:797; I.V.:2031.7; IV Piggyback:50] Out: 1275 [Urine:1275] Intake/Output this shift: Total I/O In: -  Out: 1000 [Urine:1000]  Physical Exam:  NAd In bed  Abd - soft, NT Urine clear    Lab Results:  Recent Labs  04/07/16 2334 04/08/16 0729 04/09/16 0538  HGB 10.8* 9.1* 9.2*  HCT 32.1* 27.5* 28.4*   BMET  Recent Labs  04/08/16 0729 04/09/16 0538  NA 135 132*  K 3.7 3.5  CL 98* 96*  CO2 29 28  GLUCOSE 158* 101*  BUN 13 13  CREATININE 0.92 0.92  CALCIUM 8.8* 8.6*   No results for input(s): LABPT, INR in the last 72 hours. No results for input(s): LABURIN in the last 72 hours. Results for orders placed or performed during the hospital encounter of 04/07/16  Urine culture     Status: Abnormal (Preliminary result)   Collection Time: 04/07/16 11:06 PM  Result Value Ref Range Status   Specimen Description URINE, RANDOM  Final   Special Requests NONE  Final   Culture >=100,000 COLONIES/mL GRAM NEGATIVE RODS (A)  Final   Report Status PENDING  Incomplete    Studies/Results: Dg C-arm 1-60 Min-no Report  Result Date: 04/08/2016 Fluoroscopy was utilized by the requesting physician.  No radiographic interpretation.   Ct Renal Stone Study  Result Date: 04/08/2016 CLINICAL DATA:  66 year old female with left flank pain. EXAM: CT ABDOMEN AND PELVIS WITHOUT CONTRAST TECHNIQUE: Multidetector CT imaging of the abdomen and pelvis was performed following the standard protocol without IV contrast. COMPARISON:  None. FINDINGS: Evaluation of this exam is limited  in the absence of intravenous contrast. Lower chest: The visualized lung bases are clear. No intra-abdominal free air or free fluid. Hepatobiliary: No focal liver abnormality is seen. No gallstones, gallbladder wall thickening, or biliary dilatation. Pancreas: Unremarkable. No pancreatic ductal dilatation or surrounding inflammatory changes. Spleen: Normal in size without focal abnormality. Adrenals/Urinary Tract: The adrenal glands are unremarkable. There is a 5 mm distal left ureteral stone with mild left hydronephrosis. Correlation with urinalysis recommended to exclude superimposed UTI. The right kidney, right ureter, and urinary bladder appear unremarkable. Stomach/Bowel: There is sigmoid diverticulosis without active inflammatory changes. Constipation. No bowel obstruction or active inflammation. Normal appendix. A 12 mm distal duodenal diverticulum noted. Vascular/Lymphatic: Mild aortoiliac atherosclerotic disease. No aneurysmal dilatation. The IVC is grossly unremarkable. No portal venous gas identified. Evaluation of the vasculature is limited in the absence of intravenous contrast. There is no adenopathy. Reproductive: The uterus and ovaries are grossly unremarkable. Other: None Musculoskeletal: Degenerative changes of the spine. L4-L5 disc desiccation with vacuum phenomenon. L4-L5 facet arthropathy and vacuum phenomena. No acute fracture. IMPRESSION: 1. A 5 mm distal left ureteral stone with mild left hydronephrosis. 2. Constipation. No bowel obstruction or active inflammation. Normal appendix. 3. Sigmoid diverticulosis. Electronically Signed   By: Elgie CollardArash  Radparvar M.D.   On: 04/08/2016 01:35    Assessment/Plan: POD#1 Left ureteral stent for left mid-ureteral stone, pyelonephritis: -labs stable, sugars nl -UOP better - 1000 last 8 hr -- d/c foley in AM  -Urine Cx pending --  so far > 100 K GNR   LOS: 0 days   Jerome Viglione 04/09/2016, 10:58 AM

## 2016-04-10 LAB — URINE CULTURE: Culture: >=100000 — AB

## 2016-04-10 LAB — GLUCOSE, CAPILLARY: Glucose-Capillary: 123 mg/dL — ABNORMAL HIGH (ref 65–99)

## 2016-04-10 MED ORDER — CIPROFLOXACIN HCL 500 MG PO TABS: 500.0000 mg | ORAL_TABLET | Freq: Two times a day (BID) | ORAL | Status: DC

## 2016-04-10 MED ORDER — OXYCODONE-ACETAMINOPHEN 5-325 MG PO TABS: 1.0000 | ORAL_TABLET | ORAL | 0 refills | Status: DC | PRN

## 2016-04-10 MED ORDER — CIPROFLOXACIN HCL 500 MG PO TABS: 500.0000 mg | ORAL_TABLET | Freq: Two times a day (BID) | ORAL | 0 refills | Status: DC

## 2016-04-10 MED ORDER — CIPROFLOXACIN HCL 500 MG PO TABS
500.0000 mg | ORAL_TABLET | Freq: Two times a day (BID) | ORAL | Status: DC
  Administered 2016-04-10: 500 mg via ORAL
  Filled 2016-04-10: qty 1

## 2016-04-10 NOTE — Discharge Instructions (Signed)
Ureteral Stent Implantation, Care After Refer to this sheet in the next few weeks. These instructions provide you with information about caring for yourself after your procedure. Your health care provider may also give you more specific instructions. Your treatment has been planned according to current medical practices, but problems sometimes occur. Call your health care provider if you have any problems or questions after your procedure.  Be sure to keep/make follow-up appointment so that the stent/stone can be removed.   What can I expect after the procedure? After the procedure, it is common to have:  Nausea.  Mild pain when you urinate. You may feel this pain in your lower back or lower abdomen. Pain should stop within a few minutes after you urinate. This may last for up to 1 week.  A small amount of blood in your urine for several days. Follow these instructions at home:   Medicines   Take over-the-counter and prescription medicines only as told by your health care provider.  If you were prescribed an antibiotic medicine, take it as told by your health care provider. Do not stop taking the antibiotic even if you start to feel better.  Do not drive for 24 hours if you received a sedative.  Do not drive or operate heavy machinery while taking prescription pain medicines. Activity   Return to your normal activities as told by your health care provider. Ask your health care provider what activities are safe for you.  Do not lift anything that is heavier than 10 lb (4.5 kg). Follow this limit for 1 week after your procedure, or for as long as told by your health care provider. General instructions   Watch for any blood in your urine. Call your health care provider if the amount of blood in your urine increases.  If you have a catheter:  Follow instructions from your health care provider about taking care of your catheter and collection bag.  Do not take baths, swim, or use a  hot tub until your health care provider approves.  Drink enough fluid to keep your urine clear or pale yellow.  Keep all follow-up visits as told by your health care provider. This is important. Contact a health care provider if:  You have pain that gets worse or does not get better with medicine, especially pain when you urinate.  You have difficulty urinating.  You feel nauseous or you vomit repeatedly during a period of more than 2 days after the procedure. Get help right away if:  Your urine is dark red or has blood clots in it.  You are leaking urine (have incontinence).  The end of the stent comes out of your urethra.  You cannot urinate.  You have sudden, sharp, or severe pain in your abdomen or lower back.  You have a fever. This information is not intended to replace advice given to you by your health care provider. Make sure you discuss any questions you have with your health care provider. Document Released: 09/05/2012 Document Revised: 06/11/2015 Document Reviewed: 07/18/2014 Elsevier Interactive Patient Education  2017 [employer]

## 2016-04-10 NOTE — Discharge Summary (Signed)
Physician Discharge Summary  Patient ID: Isabella Stewart MRN: [id number] DOB/AGE: 06/29/1950 66 y.o.  Admit date: 04/07/2016 Discharge date: 04/10/2016  Admission Diagnoses: Sepsis UTI Left ureteral stone  Discharge Diagnoses:  Active Problems:   Ureteral calculus   Discharged Condition: good  Hospital Course: Admit following urgent left ureteral stent. Maintained on IVF and rocephin. Urine Cx grew pan-sensitive e coli and she'll be discharged on Cipro. No complaints this AM. Voiding on her own.  Consults: None  Significant Diagnostic Studies: none  Treatments: surgery: cystoscopy, left ureteral stent   Discharge Exam: Blood pressure 110/60, pulse 83, temperature 99.4 F (37.4 C), temperature source Oral, resp. rate 18, height 5\' 2"  (1.575 m), weight 81.6 kg (180 lb), SpO2 97 %. General appearance: alert  NAD Neuro - no focal deficits CV - RRR Resp - reg effort, depth Ext - no CCE   Disposition: Home - self-care   Allergies as of 04/10/2016      Reactions   Premarin [conjugated Estrogens] Rash      Medication List    TAKE these medications   aspirin EC 81 MG tablet Take 81 mg by mouth daily.   atorvastatin 10 MG tablet Commonly known as:  LIPITOR Take 10 mg by mouth at bedtime.   chlorthalidone 25 MG tablet Commonly known as:  HYGROTON Take 25 mg by mouth daily.   ciprofloxacin 500 MG tablet Commonly known as:  CIPRO Take 1 tablet (500 mg total) by mouth 2 (two) times daily.   ergocalciferol 50000 units capsule Commonly known as:  VITAMIN D2 Take 50,000 Units by mouth every Wednesday.   fosinopril 40 MG tablet Commonly known as:  MONOPRIL Take 40 mg by mouth daily.   glipiZIDE 10 MG 24 hr tablet Commonly known as:  GLUCOTROL XL Take 10 mg by mouth daily with breakfast.   metFORMIN 500 MG tablet Commonly known as:  GLUCOPHAGE Take 500 mg by mouth 2 (two) times daily with a meal.   multivitamin-lutein Caps capsule Take 1 capsule by mouth  daily.   oxyCODONE-acetaminophen 5-325 MG tablet Commonly known as:  PERCOCET/ROXICET Take 1 tablet by mouth every 4 (four) hours as needed for moderate pain.   pioglitazone 30 MG tablet Commonly known as:  ACTOS Take 30 mg by mouth daily.      Follow-up Information    Wilkie AyePatrick McKenzie, MD Follow up in 2 week(s).   Specialty:  Urology Contact information: [address] [place] [id number] [phone]         I discussed importance of f/u with patient and husband given the stent which is temporary.   SignedMena Goes: Ryllie Nieland 04/10/2016, 10:10 AM

## 2016-04-14 ENCOUNTER — Other Ambulatory Visit: Payer: Self-pay | Admitting: Urology

## 2016-04-26 NOTE — Patient Instructions (Addendum)
Isabella Stewart  04/26/2016     @PREFPERIOPPHARMACY @   Your procedure is scheduled on  05/02/2016   Report to [facility] at  615  A.M.  Call this number if you have problems the morning of surgery:  [phone]   Remember:  Do not eat food or drink liquids after midnight.  Take these medicines the morning of surgery with A SIP OF WATER  hygronton, monopril, oxycodone.   Do not wear jewelry, make-up or nail polish.  Do not wear lotions, powders, or perfumes, or deoderant.  Do not shave 48 hours prior to surgery.  Men may shave face and neck.  Do not bring valuables to the hospital.  [facility] is not responsible for any belongings or valuables.  Contacts, dentures or bridgework may not be worn into surgery.  Leave your suitcase in the car.  After surgery it may be brought to your room.  For patients admitted to the hospital, discharge time will be determined by your treatment team.  Patients discharged the day of surgery will not be allowed to drive home.   Name and phone number of your driver:   family Special instructions:  None  Please read over the following fact sheets that you were given. Anesthesia Post-op Instructions and Care and Recovery After Surgery       Kidney Stones Kidney stones (urolithiasis) are rock-like masses that form inside of the kidneys. Kidneys are organs that make pee (urine). A kidney stone can cause very bad pain and can block the flow of pee. The stone usually leaves your body (passes) through your pee. You may need to have a doctor take out the stone. Follow these instructions at home: Eating and drinking   Drink enough fluid to keep your pee clear or pale yellow. This will help you pass the stone.  If told by your doctor, change the foods you eat (your diet). This may include:  Limiting how much salt (sodium) you eat.  Eating more fruits and vegetables.  Limiting how much meat, poultry, fish, and eggs  you eat.  Follow instructions from your doctor about eating or drinking restrictions. General instructions   Collect pee samples as told by your doctor. You may need to collect a pee sample:  24 hours after a stone comes out.  8-12 weeks after a stone comes out, and every 6-12 months after that.  Strain your pee every time you pee (urinate), for as long as told. Use the strainer that your doctor recommends.  Do not throw out the stone. Keep it so that it can be tested by your doctor.  Take over-the-counter and prescription medicines only as told by your doctor.  Keep all follow-up visits as told by your doctor. This is important. You may need follow-up tests. Preventing kidney stones  To prevent another kidney stone:  Drink enough fluid to keep your pee clear or pale yellow. This is the best way to prevent kidney stones.  Eat healthy foods.  Avoid certain foods as told by your doctor. You may be told to eat less protein.  Stay at a healthy weight. Contact a doctor if:  You have pain that gets worse or does not get better with medicine. Get help right away if:  You have a fever or chills.  You get very bad pain.  You get new pain in your belly (abdomen).  You  pass out (faint).  You cannot pee. This information is not intended to replace advice given to you by your health care provider. Make sure you discuss any questions you have with your health care provider. Document Released: 06/22/2007 Document Revised: 09/22/2015 Document Reviewed: 09/22/2015 Elsevier Interactive Patient Education  2017 [employer]  Ureteral Stent Implantation Ureteral stent implantation is a procedure to insert (implant) a flexible, soft, plastic tube (stent) into a tube (ureter) that drains urine from the kidneys. The stent supports the ureter while it heals and helps to drain urine from the kidneys. You may have a ureteral stent implanted after having a procedure to remove a blockage from the  ureter (ureterolysis or pyeloplasty).You may also have a stent implanted to open the flow of urine when you have a blockage caused by a kidney stone, tumor, blood clot, or infection. You have two ureters, one on each side of the body. The ureters connect the kidneys to the organ that holds urine until it passes out of the body (bladder). The stent is placed so that one end is in the kidney, and one end is in the bladder. The stent is usually taken out after your ureter has healed. Depending on your condition, you may have a stent for just a few weeks, or you may have a long-term stent that will need to be replaced every few months. Tell a health care provider about:  Any allergies you have.  All medicines you are taking, including vitamins, herbs, eye drops, creams, and over-the-counter medicines.  Any problems you or family members have had with anesthetic medicines.  Any blood disorders you have.  Any surgeries you have had.  Any medical conditions you have.  Whether you are pregnant or may be pregnant. What are the risks? Generally, this is a safe procedure. However, problems may occur, including:  Infection.  Bleeding.  Allergic reactions to medicines.  Damage to other structures or organs. Tearing (perforation) of the ureter is possible.  Movement of the stent away from where it is placed during surgery (migration). What happens before the procedure?  Ask your health care provider about:  Changing or stopping your regular medicines. This is especially important if you are taking diabetes medicines or blood thinners.  Taking medicines such as aspirin and ibuprofen. These medicines can thin your blood. Do not take these medicines before your procedure if your health care provider instructs you not to.  Follow instructions from your health care provider about eating or drinking restrictions.  Do not drink alcohol and do not use any tobacco products before your procedure, as  told by your health care provider.  You may be given antibiotic medicine to help prevent infection.  Plan to have someone take you home after the procedure.  If you go home right after the procedure, plan to have someone with you for 24 hours. What happens during the procedure?  An IV tube will be inserted into one of your veins.  You will be given a medicine to make you fall asleep (general anesthetic). You may also be given a medicine to help you relax (sedative).  A thin, tube-shaped instrument with a light and tiny camera at the end (cystoscope) will be inserted into your urethra. The urethra is the tube that drains urine from the bladder out of the body. In men, the urethra opens at the end of the penis. In women, the urethra opens in front of the vaginal opening.  The cystoscope will be passed  into your bladder.  A thin wire (guide wire) will be passed through your bladder and into your ureter. This is used to guide the stent into your ureter.  The stent will be inserted into your ureter.  The guide wire and the cystoscope will be removed.  A flexible tube (catheter) will be inserted through your urethra so that one end is in your bladder. This helps to drain urine from your bladder. The procedure may vary among hospitals and health care providers. What happens after the procedure?  Your blood pressure, heart rate, breathing rate, and blood oxygen level will be monitored often until the medicines you were given have worn off.  You may continue to receive medicine and fluids through an IV tube.  You may have some soreness or pain in your abdomen and urethra. Medicines will be available to help you.  You will be encouraged to get up and walk around as soon as you can.  You will have a catheter draining your urine.  You will have some blood in your urine.  Do not drive for 24 hours if you received a sedative. This information is not intended to replace advice given to you  by your health care provider. Make sure you discuss any questions you have with your health care provider. Document Released: 01/01/2000 Document Revised: 06/11/2015 Document Reviewed: 07/18/2014 Elsevier Interactive Patient Education  2017 [employer]  Ureteral Stent Implantation, Care After Refer to this sheet in the next few weeks. These instructions provide you with information about caring for yourself after your procedure. Your health care provider may also give you more specific instructions. Your treatment has been planned according to current medical practices, but problems sometimes occur. Call your health care provider if you have any problems or questions after your procedure. What can I expect after the procedure? After the procedure, it is common to have:  Nausea.  Mild pain when you urinate. You may feel this pain in your lower back or lower abdomen. Pain should stop within a few minutes after you urinate. This may last for up to 1 week.  A small amount of blood in your urine for several days. Follow these instructions at home:   Medicines   Take over-the-counter and prescription medicines only as told by your health care provider.  If you were prescribed an antibiotic medicine, take it as told by your health care provider. Do not stop taking the antibiotic even if you start to feel better.  Do not drive for 24 hours if you received a sedative.  Do not drive or operate heavy machinery while taking prescription pain medicines. Activity   Return to your normal activities as told by your health care provider. Ask your health care provider what activities are safe for you.  Do not lift anything that is heavier than 10 lb (4.5 kg). Follow this limit for 1 week after your procedure, or for as long as told by your health care provider. General instructions   Watch for any blood in your urine. Call your health care provider if the amount of blood in your urine  increases.  If you have a catheter:  Follow instructions from your health care provider about taking care of your catheter and collection bag.  Do not take baths, swim, or use a hot tub until your health care provider approves.  Drink enough fluid to keep your urine clear or pale yellow.  Keep all follow-up visits as told by your health care  provider. This is important. Contact a health care provider if:  You have pain that gets worse or does not get better with medicine, especially pain when you urinate.  You have difficulty urinating.  You feel nauseous or you vomit repeatedly during a period of more than 2 days after the procedure. Get help right away if:  Your urine is dark red or has blood clots in it.  You are leaking urine (have incontinence).  The end of the stent comes out of your urethra.  You cannot urinate.  You have sudden, sharp, or severe pain in your abdomen or lower back.  You have a fever. This information is not intended to replace advice given to you by your health care provider. Make sure you discuss any questions you have with your health care provider. Document Released: 09/05/2012 Document Revised: 06/11/2015 Document Reviewed: 07/18/2014 Elsevier Interactive Patient Education  2017 [employer]  Ureteroscopy Ureteroscopy is a procedure to check for and treat problems inside part of the urinary tract. In this procedure, a thin, tube-shaped instrument with a light at the end (ureteroscope) is used to look at the inside of the kidneys and the ureters, which are the tubes that carry urine from the kidneys to the bladder. The ureteroscope is inserted into one or both of the ureters. You may need this procedure if you have frequent urinary tract infections (UTIs), blood in your urine, or a stone in one of your ureters. A ureteroscopy can be done to find the cause of urine blockage in a ureter and to evaluate other abnormalities inside the ureters or kidneys.  If stones are found, they can be removed during the procedure. Polyps, abnormal tissue, and some types of tumors can also be removed or treated. The ureteroscope may also have a tool to remove tissue to be checked for disease under a microscope (biopsy). Tell a health care provider about:  Any allergies you have.  All medicines you are taking, including vitamins, herbs, eye drops, creams, and over-the-counter medicines.  Any problems you or family members have had with anesthetic medicines.  Any blood disorders you have.  Any surgeries you have had.  Any medical conditions you have.  Whether you are pregnant or may be pregnant. What are the risks? Generally, this is a safe procedure. However, problems may occur, including:  Bleeding.  Infection.  Allergic reactions to medicines.  Scarring that narrows the ureter (stricture).  Creating a hole in the ureter (perforation). What happens before the procedure? Staying hydrated  Follow instructions from your health care provider about hydration, which may include:  Up to 2 hours before the procedure - you may continue to drink clear liquids, such as water, clear fruit juice, black coffee, and plain tea. Eating and drinking restrictions  Follow instructions from your health care provider about eating and drinking, which may include:  8 hours before the procedure - stop eating heavy meals or foods such as meat, fried foods, or fatty foods.  6 hours before the procedure - stop eating light meals or foods, such as toast or cereal.  6 hours before the procedure - stop drinking milk or drinks that contain milk.  2 hours before the procedure - stop drinking clear liquids. Medicines   Ask your health care provider about:  Changing or stopping your regular medicines. This is especially important if you are taking diabetes medicines or blood thinners.  Taking medicines such as aspirin and ibuprofen. These medicines can thin your  blood.  Do not take these medicines before your procedure if your health care provider instructs you not to.  You may be given antibiotic medicine to help prevent infection. General instructions   You may have a urine sample taken to check for infection.  Plan to have someone take you home from the hospital or clinic. What happens during the procedure?  To reduce your risk of infection:  Your health care team will wash or sanitize their hands.  Your skin will be washed with soap.  An IV tube will be inserted into one of your veins.  You will be given one of the following:  A medicine to help you relax (sedative).  A medicine to make you fall asleep (general anesthetic).  A medicine that is injected into your spine to numb the area below and slightly above the injection site (spinal anesthetic).  To lower your risk of infection, you may be given an antibiotic medicine by an injection or through the IV tube.  The opening from which you urinate (urethra) will be cleaned with a germ-killing solution.  The ureteroscope will be passed through your urethra into your bladder.  A salt-water solution will flow through the ureteroscope to fill your bladder. This will help the health care provider see the openings of your ureters more clearly.  Then, the ureteroscope will be passed into your ureter.  If a growth is found, a piece of it may be removed so it can be examined under a microscope (biopsy).  If a stone is found, it may be removed through the ureteroscope, or the stone may be broken up using a laser, shock waves, or electrical energy.  In some cases, if the ureter is too small, a tube may be inserted that keeps the ureter open (ureteral stent). The stent may be left in place for 1 or 2 weeks to keep the ureter open, and then the ureteroscopy procedure will be performed.  The scope will be removed, and your bladder will be emptied. The procedure may vary among health care  providers and hospitals. What happens after the procedure?  Your blood pressure, heart rate, breathing rate, and blood oxygen level will be monitored until the medicines you were given have worn off.  You may be asked to urinate.  Donot drive for 24 hours if you were given a sedative. This information is not intended to replace advice given to you by your health care provider. Make sure you discuss any questions you have with your health care provider. Document Released: 01/08/2013 Document Revised: 10/20/2015 Document Reviewed: 10/16/2015 Elsevier Interactive Patient Education  2017 [employer]  Ureteroscopy, Care After This sheet gives you information about how to care for yourself after your procedure. Your health care provider may also give you more specific instructions. If you have problems or questions, contact your health care provider. What can I expect after the procedure? After the procedure, it is common to have:  A burning sensation when you urinate.  Blood in your urine.  Mild discomfort in the bladder area or kidney area when urinating.  Needing to urinate more often or urgently. Follow these instructions at home: Medicines   Take over-the-counter and prescription medicines only as told by your health care provider.  If you were prescribed an antibiotic medicine, take it as told by your health care provider. Do not stop taking the antibiotic even if you start to feel better. General instructions    Donot drive for 24 hours if you were  given a medicine to help you relax (sedative) during your procedure.  To relieve burning, try taking a warm bath or holding a warm washcloth over your groin.  Drink enough fluid to keep your urine clear or pale yellow.  Drink two 8-ounce glasses of water every hour for the first 2 hours after you get home.  Continue to drink water often at home.  You can eat what you usually do.  Keep all follow-up visits as told by your  health care provider. This is important.  If you had a tube placed to keep urine flowing (ureteral stent), ask your health care provider when you need to return to have it removed. Contact a health care provider if:  You have chills or a fever.  You have burning pain for longer than 24 hours after the procedure.  You have blood in your urine for longer than 24 hours after the procedure. Get help right away if:  You have large amounts of blood in your urine.  You have blood clots in your urine.  You have very bad pain.  You have chest pain or trouble breathing.  You are unable to urinate and you have the feeling of a full bladder. This information is not intended to replace advice given to you by your health care provider. Make sure you discuss any questions you have with your health care provider. Document Released: 01/08/2013 Document Revised: 10/20/2015 Document Reviewed: 10/16/2015 Elsevier Interactive Patient Education  2017 [employer]  Cystoscopy Cystoscopy is a procedure that is used to help diagnose and sometimes treat conditions that affect that lower urinary tract. The lower urinary tract includes the bladder and the tube that drains urine from the bladder out of the body (urethra). Cystoscopy is performed with a thin, tube-shaped instrument with a light and camera at the end (cystoscope). The cystoscope may be hard (rigid) or flexible, depending on the goal of the procedure.The cystoscope is inserted through the urethra, into the bladder. Cystoscopy may be recommended if you have:  Urinary tractinfections that keep coming back (recurring).  Blood in the urine (hematuria).  Loss of bladder control (urinary incontinence) or an overactive bladder.  Unusual cells found in a urine sample.  A blockage in the urethra.  Painful urination.  An abnormality in the bladder found during an intravenous pyelogram (IVP) or CT scan. Cystoscopy may also be done to remove a  sample of tissue to be examined under a microscope (biopsy). Tell a health care provider about:  Any allergies you have.  All medicines you are taking, including vitamins, herbs, eye drops, creams, and over-the-counter medicines.  Any problems you or family members have had with anesthetic medicines.  Any blood disorders you have.  Any surgeries you have had.  Any medical conditions you have.  Whether you are pregnant or may be pregnant. What are the risks? Generally, this is a safe procedure. However, problems may occur, including:  Infection.  Bleeding.  Allergic reactions to medicines.  Damage to other structures or organs. What happens before the procedure?  Ask your health care provider about:  Changing or stopping your regular medicines. This is especially important if you are taking diabetes medicines or blood thinners.  Taking medicines such as aspirin and ibuprofen. These medicines can thin your blood. Do not take these medicines before your procedure if your health care provider instructs you not to.  Follow instructions from your health care provider about eating or drinking restrictions.  You may be given  antibiotic medicine to help prevent infection.  You may have an exam or testing, such as X-rays of the bladder, urethra, or kidneys.  You may have urine tests to check for signs of infection.  Plan to have someone take you home after the procedure. What happens during the procedure?  To reduce your risk of infection,your health care team will wash or sanitize their hands.  You will be given one or more of the following:  A medicine to help you relax (sedative).  A medicine to numb the area (local anesthetic).  The area around the opening of your urethra will be cleaned.  The cystoscope will be passed through your urethra into your bladder.  Germ-free (sterile)fluid will flow through the cystoscope to fill your bladder. The fluid will stretch  your bladder so that your surgeon can clearly examine your bladder walls.  The cystoscope will be removed and your bladder will be emptied. The procedure may vary among health care providers and hospitals. What happens after the procedure?  You may have some soreness or pain in your abdomen and urethra. Medicines will be available to help you.  You may have some blood in your urine.  Do not drive for 24 hours if you received a sedative. This information is not intended to replace advice given to you by your health care provider. Make sure you discuss any questions you have with your health care provider. Document Released: 01/01/2000 Document Revised: 05/14/2015 Document Reviewed: 11/20/2014 Elsevier Interactive Patient Education  2017 [employer]  Cystoscopy, Care After Refer to this sheet in the next few weeks. These instructions provide you with information about caring for yourself after your procedure. Your health care provider may also give you more specific instructions. Your treatment has been planned according to current medical practices, but problems sometimes occur. Call your health care provider if you have any problems or questions after your procedure. What can I expect after the procedure? After the procedure, it is common to have:  Mild pain when you urinate. Pain should stop within a few minutes after you urinate. This may last for up to 1 week.  A small amount of blood in your urine for several days.  Feeling like you need to urinate but producing only a small amount of urine. Follow these instructions at home:   Medicines   Take over-the-counter and prescription medicines only as told by your health care provider.  If you were prescribed an antibiotic medicine, take it as told by your health care provider. Do not stop taking the antibiotic even if you start to feel better. General instructions    Return to your normal activities as told by your health care  provider. Ask your health care provider what activities are safe for you.  Do not drive for 24 hours if you received a sedative.  Watch for any blood in your urine. If the amount of blood in your urine increases, call your health care provider.  Follow instructions from your health care provider about eating or drinking restrictions.  If a tissue sample was removed for testing (biopsy) during your procedure, it is your responsibility to get your test results. Ask your health care provider or the department performing the test when your results will be ready.  Drink enough fluid to keep your urine clear or pale yellow.  Keep all follow-up visits as told by your health care provider. This is important. Contact a health care provider if:  You have pain that  gets worse or does not get better with medicine, especially pain when you urinate.  You have difficulty urinating. Get help right away if:  You have more blood in your urine.  You have blood clots in your urine.  You have abdominal pain.  You have a fever or chills.  You are unable to urinate. This information is not intended to replace advice given to you by your health care provider. Make sure you discuss any questions you have with your health care provider. Document Released: 07/23/2004 Document Revised: 06/11/2015 Document Reviewed: 11/20/2014 Elsevier Interactive Patient Education  2017 [employer] PATIENT INSTRUCTIONS POST-ANESTHESIA  IMMEDIATELY FOLLOWING SURGERY:  Do not drive or operate machinery for the first twenty four hours after surgery.  Do not make any important decisions for twenty four hours after surgery or while taking narcotic pain medications or sedatives.  If you develop intractable nausea and vomiting or a severe headache please notify your doctor immediately.  FOLLOW-UP:  Please make an appointment with your surgeon as instructed. You do not need to follow up with anesthesia unless specifically  instructed to do so.  WOUND CARE INSTRUCTIONS (if applicable):  Keep a dry clean dressing on the anesthesia/puncture wound site if there is drainage.  Once the wound has quit draining you may leave it open to air.  Generally you should leave the bandage intact for twenty four hours unless there is drainage.  If the epidural site drains for more than 36-48 hours please call the anesthesia department.  QUESTIONS?:  Please feel free to call your physician or the hospital operator if you have any questions, and they will be happy to assist you.

## 2016-04-28 ENCOUNTER — Encounter (HOSPITAL_COMMUNITY)
Admission: RE | Admit: 2016-04-28 | Discharge: 2016-04-28 | Disposition: A | Discharge status: Home / Self Care | Payer: BLUE CROSS/BLUE SHIELD | Source: Ambulatory Visit | Attending: Urology | Admitting: Urology

## 2016-04-28 ENCOUNTER — Encounter (HOSPITAL_COMMUNITY): Payer: Self-pay

## 2016-04-28 DIAGNOSIS — Z01818 Encounter for other preprocedural examination: Secondary | ICD-10-CM | POA: Diagnosis present

## 2016-04-28 DIAGNOSIS — Z01812 Encounter for preprocedural laboratory examination: Secondary | ICD-10-CM | POA: Diagnosis not present

## 2016-04-28 DIAGNOSIS — Z01810 Encounter for preprocedural cardiovascular examination: Secondary | ICD-10-CM | POA: Insufficient documentation

## 2016-04-28 HISTORY — DX: Personal history of urinary calculi: Z87.442

## 2016-04-28 HISTORY — DX: Cardiac murmur, unspecified: R01.1

## 2016-04-28 LAB — BASIC METABOLIC PANEL
Anion gap: 8 (ref 5–15)
BUN: 20 mg/dL (ref 6–20)
CALCIUM: 9.7 mg/dL (ref 8.9–10.3)
CO2: 29 mmol/L (ref 22–32)
Chloride: 100 mmol/L — ABNORMAL LOW (ref 101–111)
Creatinine, Ser: 0.84 mg/dL (ref 0.44–1.00)
GFR calc Af Amer: >60 mL/min (ref >60)
GLUCOSE: 205 mg/dL — AB (ref 65–99)
POTASSIUM: 3.5 mmol/L (ref 3.5–5.1)
SODIUM: 137 mmol/L (ref 135–145)

## 2016-04-28 LAB — CBC WITH DIFFERENTIAL/PLATELET
BASOS ABS: 0 10*3/uL (ref 0.0–0.1)
Basophils Relative: 1 %
EOS ABS: 0.3 10*3/uL (ref 0.0–0.7)
EOS PCT: 6 %
HCT: 32.8 % — ABNORMAL LOW (ref 36.0–46.0)
Hemoglobin: 10.6 g/dL — ABNORMAL LOW (ref 12.0–15.0)
LYMPHS ABS: 1 10*3/uL (ref 0.7–4.0)
LYMPHS PCT: 18 %
MCH: 29.7 pg (ref 26.0–34.0)
MCHC: 32.3 g/dL (ref 30.0–36.0)
MCV: 91.9 fL (ref 78.0–100.0)
MONO ABS: 0.4 10*3/uL (ref 0.1–1.0)
Monocytes Relative: 7 %
Neutro Abs: 3.7 10*3/uL (ref 1.7–7.7)
Neutrophils Relative %: 68 %
PLATELETS: 282 10*3/uL (ref 150–400)
RBC: 3.57 MIL/uL — AB (ref 3.87–5.11)
RDW: 15.3 % (ref 11.5–15.5)
WBC: 5.4 10*3/uL (ref 4.0–10.5)

## 2016-05-02 ENCOUNTER — Ambulatory Visit (HOSPITAL_COMMUNITY): Payer: BLUE CROSS/BLUE SHIELD

## 2016-05-02 ENCOUNTER — Ambulatory Visit (HOSPITAL_COMMUNITY): Payer: BLUE CROSS/BLUE SHIELD | Admitting: Anesthesiology

## 2016-05-02 ENCOUNTER — Encounter (HOSPITAL_COMMUNITY)
Admission: RE | Disposition: A | Discharge status: Home / Self Care | Payer: Self-pay | Source: Ambulatory Visit | Attending: Urology

## 2016-05-02 ENCOUNTER — Encounter (HOSPITAL_COMMUNITY): Payer: Self-pay | Admitting: *Deleted

## 2016-05-02 ENCOUNTER — Ambulatory Visit (HOSPITAL_COMMUNITY)
Admission: RE | Admit: 2016-05-02 | Discharge: 2016-05-02 | Disposition: A | Discharge status: Home / Self Care | Payer: BLUE CROSS/BLUE SHIELD | Source: Ambulatory Visit | Attending: Urology | Admitting: Urology

## 2016-05-02 DIAGNOSIS — Z79899 Other long term (current) drug therapy: Secondary | ICD-10-CM | POA: Diagnosis not present

## 2016-05-02 DIAGNOSIS — N132 Hydronephrosis with renal and ureteral calculous obstruction: Secondary | ICD-10-CM | POA: Insufficient documentation

## 2016-05-02 DIAGNOSIS — A419 Sepsis, unspecified organism: Secondary | ICD-10-CM | POA: Diagnosis not present

## 2016-05-02 DIAGNOSIS — Z7984 Long term (current) use of oral hypoglycemic drugs: Secondary | ICD-10-CM | POA: Diagnosis not present

## 2016-05-02 DIAGNOSIS — N1330 Unspecified hydronephrosis: Secondary | ICD-10-CM | POA: Diagnosis not present

## 2016-05-02 DIAGNOSIS — Z888 Allergy status to other drugs, medicaments and biological substances status: Secondary | ICD-10-CM | POA: Diagnosis not present

## 2016-05-02 DIAGNOSIS — E119 Type 2 diabetes mellitus without complications: Secondary | ICD-10-CM | POA: Insufficient documentation

## 2016-05-02 DIAGNOSIS — I10 Essential (primary) hypertension: Secondary | ICD-10-CM | POA: Insufficient documentation

## 2016-05-02 DIAGNOSIS — Z7982 Long term (current) use of aspirin: Secondary | ICD-10-CM | POA: Insufficient documentation

## 2016-05-02 DIAGNOSIS — N209 Urinary calculus, unspecified: Secondary | ICD-10-CM

## 2016-05-02 HISTORY — PX: CYSTOSCOPY WITH RETROGRADE PYELOGRAM, URETEROSCOPY AND STENT PLACEMENT: SHX5789

## 2016-05-02 LAB — GLUCOSE, CAPILLARY
GLUCOSE-CAPILLARY: 128 mg/dL — AB (ref 65–99)
GLUCOSE-CAPILLARY: 122 mg/dL — AB (ref 65–99)

## 2016-05-02 SURGERY — CYSTOURETEROSCOPY, WITH RETROGRADE PYELOGRAM AND STENT INSERTION
Anesthesia: General | Laterality: Left

## 2016-05-02 MED ORDER — DIATRIZOATE MEGLUMINE 30 % UR SOLN
URETHRAL | Status: AC
  Filled 2016-05-02: qty 300

## 2016-05-02 MED ORDER — LIDOCAINE HCL 1 % IJ SOLN
INTRAMUSCULAR | Status: DC | PRN
  Administered 2016-05-02: 40 mg via INTRADERMAL

## 2016-05-02 MED ORDER — MIDAZOLAM HCL 2 MG/2ML IJ SOLN
1.0000 mg | INTRAMUSCULAR | Status: AC
  Administered 2016-05-02 (×2): 2 mg via INTRAVENOUS
  Filled 2016-05-02: qty 2

## 2016-05-02 MED ORDER — FENTANYL CITRATE (PF) 100 MCG/2ML IJ SOLN
INTRAMUSCULAR | Status: DC | PRN
  Administered 2016-05-02: 50 ug via INTRAVENOUS

## 2016-05-02 MED ORDER — ONDANSETRON HCL 4 MG/2ML IJ SOLN
4.0000 mg | Freq: Once | INTRAMUSCULAR | Status: AC
  Administered 2016-05-02: 4 mg via INTRAVENOUS

## 2016-05-02 MED ORDER — ROCURONIUM BROMIDE 50 MG/5ML IV SOLN
INTRAVENOUS | Status: AC
  Filled 2016-05-02: qty 1

## 2016-05-02 MED ORDER — FENTANYL CITRATE (PF) 250 MCG/5ML IJ SOLN
INTRAMUSCULAR | Status: AC
  Filled 2016-05-02: qty 5

## 2016-05-02 MED ORDER — GLYCOPYRROLATE 0.2 MG/ML IJ SOLN
INTRAMUSCULAR | Status: AC
  Filled 2016-05-02: qty 4

## 2016-05-02 MED ORDER — LIDOCAINE HCL 2 % EX GEL
CUTANEOUS | Status: AC
  Filled 2016-05-02: qty 10

## 2016-05-02 MED ORDER — SULFAMETHOXAZOLE-TRIMETHOPRIM 800-160 MG PO TABS: 1.0000 | ORAL_TABLET | Freq: Two times a day (BID) | ORAL | 0 refills | Status: DC

## 2016-05-02 MED ORDER — OXYCODONE-ACETAMINOPHEN 5-325 MG PO TABS: 1.0000 | ORAL_TABLET | ORAL | 0 refills | Status: DC | PRN

## 2016-05-02 MED ORDER — PROPOFOL 10 MG/ML IV BOLUS
INTRAVENOUS | Status: DC | PRN
  Administered 2016-05-02: 150 mg via INTRAVENOUS

## 2016-05-02 MED ORDER — SUCCINYLCHOLINE CHLORIDE 20 MG/ML IJ SOLN
INTRAMUSCULAR | Status: AC
  Filled 2016-05-02: qty 1

## 2016-05-02 MED ORDER — DEXTROSE 5 % IV SOLN
2.0000 g | INTRAVENOUS | Status: AC
  Administered 2016-05-02: 2 g via INTRAVENOUS
  Filled 2016-05-02: qty 2

## 2016-05-02 MED ORDER — LACTATED RINGERS IV SOLN
INTRAVENOUS | Status: DC
  Administered 2016-05-02: 07:00:00 via INTRAVENOUS

## 2016-05-02 MED ORDER — DIATRIZOATE MEGLUMINE 30 % UR SOLN
URETHRAL | Status: DC | PRN
  Administered 2016-05-02: 10 mL via URETHRAL

## 2016-05-02 MED ORDER — FENTANYL CITRATE (PF) 100 MCG/2ML IJ SOLN: 25.0000 ug | INTRAMUSCULAR | Status: DC | PRN

## 2016-05-02 MED ORDER — LIDOCAINE HCL (PF) 1 % IJ SOLN
INTRAMUSCULAR | Status: AC
  Filled 2016-05-02: qty 10

## 2016-05-02 MED ORDER — PROPOFOL 10 MG/ML IV BOLUS
INTRAVENOUS | Status: AC
  Filled 2016-05-02: qty 20

## 2016-05-02 MED ORDER — ONDANSETRON HCL 4 MG/2ML IJ SOLN
INTRAMUSCULAR | Status: AC
Start: 2016-05-02 — End: ?
  Filled 2016-05-02: qty 2

## 2016-05-02 MED ORDER — MIDAZOLAM HCL 2 MG/2ML IJ SOLN
INTRAMUSCULAR | Status: AC
  Filled 2016-05-02: qty 2

## 2016-05-02 MED ORDER — SODIUM CHLORIDE 0.9 % IJ SOLN
INTRAMUSCULAR | Status: AC
  Filled 2016-05-02: qty 10

## 2016-05-02 SURGICAL SUPPLY — 30 items
BAG HAMPER (MISCELLANEOUS) ×3 IMPLANT
BAG URO CATCHER STRL LF (MISCELLANEOUS) ×3 IMPLANT
BASKET LASER NITINOL 1.9FR (BASKET) IMPLANT
BASKET ZERO TIP NITINOL 2.4FR (BASKET) IMPLANT
CATH INTERMIT  6FR 70CM (CATHETERS) IMPLANT
CLOTH BEACON ORANGE TIMEOUT ST (SAFETY) ×3 IMPLANT
EXTRACTOR STONE NITINOL NGAGE (UROLOGICAL SUPPLIES) ×3 IMPLANT
FIBER LASER FLEXIVA 200 (UROLOGICAL SUPPLIES) ×3 IMPLANT
FIBER LASER FLEXIVA 365 (UROLOGICAL SUPPLIES) IMPLANT
GLOVE BIO SURGEON STRL SZ8 (GLOVE) ×6 IMPLANT
GLOVE BIOGEL PI IND STRL 6.5 (GLOVE) ×1 IMPLANT
GLOVE BIOGEL PI INDICATOR 6.5 (GLOVE) ×2
GLOVE SS BIOGEL STRL SZ 7 (GLOVE) ×1 IMPLANT
GLOVE SUPERSENSE BIOGEL SZ 7 (GLOVE) ×2
GOWN STRL REUS W/ TWL LRG LVL3 (GOWN DISPOSABLE) ×1 IMPLANT
GOWN STRL REUS W/ TWL XL LVL3 (GOWN DISPOSABLE) ×2 IMPLANT
GOWN STRL REUS W/TWL LRG LVL3 (GOWN DISPOSABLE) ×2
GOWN STRL REUS W/TWL XL LVL3 (GOWN DISPOSABLE) ×4
GUIDEWIRE ANG ZIPWIRE 038X150 (WIRE) ×3 IMPLANT
GUIDEWIRE STR DUAL SENSOR (WIRE) IMPLANT
GUIDEWIRE STR ZIPWIRE 035X150 (MISCELLANEOUS) ×3 IMPLANT
IV NS IRRIG 3000ML ARTHROMATIC (IV SOLUTION) ×6 IMPLANT
LASER FIBER DISP (UROLOGICAL SUPPLIES) IMPLANT
MANIFOLD NEPTUNE II (INSTRUMENTS) ×3 IMPLANT
PACK CYSTO (CUSTOM PROCEDURE TRAY) ×3 IMPLANT
PAD ARMBOARD 7.5X6 YLW CONV (MISCELLANEOUS) ×3 IMPLANT
STENT URET 6FRX26 CONTOUR (STENTS) ×3 IMPLANT
SYRINGE 10CC LL (SYRINGE) ×3 IMPLANT
TOWEL OR 17X26 4PK STRL BLUE (TOWEL DISPOSABLE) ×3 IMPLANT
TUBE FEEDING 8FR 16IN STR KANG (MISCELLANEOUS) IMPLANT

## 2016-05-02 NOTE — Brief Op Note (Signed)
05/02/2016  9:05 AM  PATIENT:  Isabella Stewart  66 y.o. female  PRE-OPERATIVE DIAGNOSIS:  LEFT URETERAL CALCULUS  POST-OPERATIVE DIAGNOSIS:  LEFT URETERAL CALCULUS  PROCEDURE:  Procedure(s) with comments: CYSTOSCOPY WITH RETROGRADE PYELOGRAM, URETEROSCOPY AND STENT REPLACEMENT (Left) - 1 HR [phone] [id number] HOLMIUM LASER APPLICATION (Left)  SURGEON:  Surgeon(s) and Role:    * Malen Gauze, MD - Primary  PHYSICIAN ASSISTANT:   ASSISTANTS: none   ANESTHESIA:   general  EBL:  Total I/O In: 300 [I.V.:300] Out: 0   BLOOD ADMINISTERED:none  DRAINS: left 6 x26 JJ ureteral stent with tether   LOCAL MEDICATIONS USED:  NONE  SPECIMEN:  No Specimen  DISPOSITION OF SPECIMEN:  N/A  COUNTS:  YES  TOURNIQUET:  * No tourniquets in log *  DICTATION: .Note written in EPIC  PLAN OF CARE: Discharge to home after PACU  PATIENT DISPOSITION:  PACU - hemodynamically stable.   Delay start of Pharmacological VTE agent (>24hrs) due to surgical blood loss or risk of bleeding: not applicable

## 2016-05-02 NOTE — Transfer of Care (Signed)
Immediate Anesthesia Transfer of Care Note  Patient: Isabella Stewart  Procedure(s) Performed: Procedure(s) with comments: CYSTOSCOPY WITH RETROGRADE PYELOGRAM, URETEROSCOPY AND STENT REPLACEMENT (Left) - 1 HR [phone] [id number] HOLMIUM LASER APPLICATION (Left)  Patient Location: PACU  Anesthesia Type:General  Level of Consciousness: awake and alert   Airway & Oxygen Therapy: Patient Spontanous Breathing and Patient connected to face mask oxygen  Post-op Assessment: Report given to RN  Post vital signs: Reviewed and stable  Last Vitals:  Vitals:   05/02/16 0715 05/02/16 0730  BP: 121/76 114/67  Resp: 12 15  Temp:      Last Pain:  Vitals:   05/02/16 0625  TempSrc: Oral      Patients Stated Pain Goal: 7 (05/02/16 0625)  Complications: No apparent anesthesia complications

## 2016-05-02 NOTE — Discharge Instructions (Signed)
YOU HAVE A STENT IN PLACE WITH STRING ATTACHED.  REMOVE STENT AT HOME ON Wednesday AT AROUND 9 AM.  CONTACT DR MCKENZIE'S OFFICE WITH ANY QUESTIONS OR CONCERNS.  Ureteral Stent Implantation, Care After Refer to this sheet in the next few weeks. These instructions provide you with information about caring for yourself after your procedure. Your health care provider may also give you more specific instructions. Your treatment has been planned according to current medical practices, but problems sometimes occur. Call your health care provider if you have any problems or questions after your procedure. What can I expect after the procedure? After the procedure, it is common to have:  Nausea.  Mild pain when you urinate. You may feel this pain in your lower back or lower abdomen. Pain should stop within a few minutes after you urinate. This may last for up to 1 week.  A small amount of blood in your urine for several days. Follow these instructions at home:   Medicines   Take over-the-counter and prescription medicines only as told by your health care provider.  If you were prescribed an antibiotic medicine, take it as told by your health care provider. Do not stop taking the antibiotic even if you start to feel better.  Do not drive for 24 hours if you received a sedative.  Do not drive or operate heavy machinery while taking prescription pain medicines. Activity   Return to your normal activities as told by your health care provider. Ask your health care provider what activities are safe for you.  Do not lift anything that is heavier than 10 lb (4.5 kg). Follow this limit for 1 week after your procedure, or for as long as told by your health care provider. General instructions   Watch for any blood in your urine. Call your health care provider if the amount of blood in your urine increases.  If you have a catheter:  Follow instructions from your health care provider about taking care  of your catheter and collection bag.  Do not take baths, swim, or use a hot tub until your health care provider approves.  Drink enough fluid to keep your urine clear or pale yellow.  Keep all follow-up visits as told by your health care provider. This is important. Contact a health care provider if:  You have pain that gets worse or does not get better with medicine, especially pain when you urinate.  You have difficulty urinating.  You feel nauseous or you vomit repeatedly during a period of more than 2 days after the procedure. Get help right away if:  Your urine is dark red or has blood clots in it.  You are leaking urine (have incontinence).  The end of the stent comes out of your urethra.  You cannot urinate.  You have sudden, sharp, or severe pain in your abdomen or lower back.  You have a fever. This information is not intended to replace advice given to you by your health care provider. Make sure you discuss any questions you have with your health care provider. Document Released: 09/05/2012 Document Revised: 06/11/2015 Document Reviewed: 07/18/2014 Elsevier Interactive Patient Education  2017 [employer] Cystoscopy, Care After Refer to this sheet in the next few weeks. These instructions provide you with information about caring for yourself after your procedure. Your health care provider may also give you more specific instructions. Your treatment has been planned according to current medical practices, but problems sometimes occur. Call your health care  provider if you have any problems or questions after your procedure. What can I expect after the procedure? After the procedure, it is common to have:  Mild pain when you urinate. Pain should stop within a few minutes after you urinate. This may last for up to 1 week.  A small amount of blood in your urine for several days.  Feeling like you need to urinate but producing only a small amount of urine. Follow these  instructions at home:   Medicines   Take over-the-counter and prescription medicines only as told by your health care provider.  If you were prescribed an antibiotic medicine, take it as told by your health care provider. Do not stop taking the antibiotic even if you start to feel better. General instructions    Return to your normal activities as told by your health care provider. Ask your health care provider what activities are safe for you.  Do not drive for 24 hours if you received a sedative.  Watch for any blood in your urine. If the amount of blood in your urine increases, call your health care provider.  Follow instructions from your health care provider about eating or drinking restrictions.  If a tissue sample was removed for testing (biopsy) during your procedure, it is your responsibility to get your test results. Ask your health care provider or the department performing the test when your results will be ready.  Drink enough fluid to keep your urine clear or pale yellow.  Keep all follow-up visits as told by your health care provider. This is important. Contact a health care provider if:  You have pain that gets worse or does not get better with medicine, especially pain when you urinate.  You have difficulty urinating. Get help right away if:  You have more blood in your urine.  You have blood clots in your urine.  You have abdominal pain.  You have a fever or chills.  You are unable to urinate. This information is not intended to replace advice given to you by your health care provider. Make sure you discuss any questions you have with your health care provider. Document Released: 07/23/2004 Document Revised: 06/11/2015 Document Reviewed: 11/20/2014 Elsevier Interactive Patient Education  2017 [employer].

## 2016-05-02 NOTE — Anesthesia Procedure Notes (Signed)
Procedure Name: LMA Insertion Date/Time: 05/02/2016 8:01 AM Performed by: Glynn Octave E Pre-anesthesia Checklist: Patient identified, Patient being monitored, Emergency Drugs available, Timeout performed and Suction available Patient Re-evaluated:Patient Re-evaluated prior to inductionOxygen Delivery Method: Circle System Utilized Preoxygenation: Pre-oxygenation with 100% oxygen Intubation Type: IV induction Ventilation: Mask ventilation without difficulty LMA: LMA inserted LMA Size: 4.0 Number of attempts: 1 Placement Confirmation: positive ETCO2 and breath sounds checked- equal and bilateral

## 2016-05-02 NOTE — Anesthesia Postprocedure Evaluation (Signed)
Anesthesia Post Note  Patient: Isabella Stewart  Procedure(s) Performed: Procedure(s) (LRB): CYSTOSCOPY WITH RETROGRADE PYELOGRAM, URETEROSCOPY AND STENT REPLACEMENT (Left) HOLMIUM LASER APPLICATION (Left)  Patient location during evaluation: Short Stay Anesthesia Type: General Level of consciousness: awake and alert Vital Signs Assessment: post-procedure vital signs reviewed and stable Respiratory status: spontaneous breathing Cardiovascular status: stable Anesthetic complications: no     Last Vitals:  Vitals:   05/02/16 0940 05/02/16 0943  BP:  136/74  Pulse: 82 88  Resp: 16 18  Temp:  36.6 C    Last Pain:  Vitals:   05/02/16 0943  TempSrc: Oral                 Breane Grunwald

## 2016-05-02 NOTE — Anesthesia Preprocedure Evaluation (Signed)
Anesthesia Evaluation  Patient identified by MRN, date of birth, ID band Patient awake    Reviewed: Allergy & Precautions, H&P , NPO status , Patient's Chart, lab work & pertinent test results  Airway Mallampati: II   Neck ROM: full    Dental  (+) Teeth Intact   Pulmonary neg pulmonary ROS,    breath sounds clear to auscultation       Cardiovascular hypertension, Pt. on medications  Rhythm:regular Rate:Normal     Neuro/Psych    GI/Hepatic negative GI ROS,   Endo/Other  diabetes, Type 2  Renal/GU Renal diseasestones     Musculoskeletal   Abdominal   Peds  Hematology   Anesthesia Other Findings   Reproductive/Obstetrics                             Anesthesia Physical Anesthesia Plan  ASA: III  Anesthesia Plan: General   Post-op Pain Management:    Induction: Intravenous  Airway Management Planned: LMA  Additional Equipment:   Intra-op Plan:   Post-operative Plan:   Informed Consent: I have reviewed the patients History and Physical, chart, labs and discussed the procedure including the risks, benefits and alternatives for the proposed anesthesia with the patient or authorized representative who has indicated his/her understanding and acceptance.     Plan Discussed with: CRNA, Anesthesiologist and Surgeon  Anesthesia Plan Comments:         Anesthesia Quick Evaluation

## 2016-05-02 NOTE — Interval H&P Note (Signed)
History and Physical Interval Note:  05/02/2016 7:48 AM  Isabella Stewart  has presented today for surgery, with the diagnosis of LEFT URETERAL CALCULUS  The various methods of treatment have been discussed with the patient and family. After consideration of risks, benefits and other options for treatment, the patient has consented to  Procedure(s) with comments: CYSTOSCOPY WITH RETROGRADE PYELOGRAM, URETEROSCOPY AND STENT REPLACEMENT (Left) - 1 HR [phone] [id number] HOLMIUM LASER APPLICATION (Left) as a surgical intervention .  The patient's history has been reviewed, patient examined, no change in status, stable for surgery.  I have reviewed the patient's chart and labs.  Questions were answered to the patient's satisfaction.     Wilkie Aye

## 2016-05-02 NOTE — H&P (View-Only) (Signed)
Urology Admission H&P  Chief Complaint: left flank pain  History of Present Illness: Ms Isabella Stewart is a 66yo with a hx of nephrolithiasis and DMII who presented to [facility] ER with a 1 day hx of svere sharp, constant, nonraditiang left flank pain. She was having chills at home. She has associated nausea and vomiting. She has 1-2 stone events a year. She had a septic stone event in 1999. WBC count was normal. Ct scan showed a 5mm left proximal ureteral calculus with mild hydronephrosis and perinephric stranding. She had a fever of 100F in the ER.   Past Medical History:  Diagnosis Date  . Diabetes mellitus without complication (HCC)   . Hypertension   . Renal disorder    kidney stones   Past Surgical History:  Procedure Laterality Date  . BACK SURGERY      Home Medications:   (Not in a hospital admission) Allergies:  Allergies  Allergen Reactions  . Premarin [Conjugated Estrogens] Rash    History reviewed. No pertinent family history. Social History:  reports that she has never smoked. She has never used smokeless tobacco. She reports that she does not drink alcohol or use drugs.  Review of Systems  Constitutional: Positive for chills and fever.  Gastrointestinal: Positive for nausea and vomiting.  Genitourinary: Positive for dysuria, flank pain, frequency, hematuria and urgency.  All other systems reviewed and are negative.   Physical Exam:  Vital signs in last 24 hours: Temp:  [98.6 F (37 C)-99.9 F (37.7 C)] 98.6 F (37 C) (03/23 0349) Pulse Rate:  [91-122] 98 (03/23 0349) Resp:  [15-18] 16 (03/23 0349) BP: (105-135)/(62-72) 105/62 (03/23 0349) SpO2:  [96 %-100 %] 97 % (03/23 0434) Weight:  [81.6 kg (180 lb)] 81.6 kg (180 lb) (03/22 1934) Physical Exam  Constitutional: She is oriented to person, place, and time. She appears well-developed and well-nourished.  HENT:  Head: Normocephalic and atraumatic.  Eyes: EOM are normal. Pupils are equal, round, and reactive to  light.  Neck: Normal range of motion. No thyromegaly present.  Cardiovascular: Normal rate and regular rhythm.   Respiratory: Effort normal. No respiratory distress.  GI: Soft. She exhibits no distension.  Musculoskeletal: Normal range of motion. She exhibits no edema.  Neurological: She is alert and oriented to person, place, and time.  Skin: Skin is warm and dry.  Psychiatric: She has a normal mood and affect. Her behavior is normal. Judgment and thought content normal.    Laboratory Data:  Results for orders placed or performed during the hospital encounter of 04/07/16 (from the past 24 hour(s))  Urinalysis, Routine w reflex microscopic     Status: Abnormal   Collection Time: 04/07/16 11:06 PM  Result Value Ref Range   Color, Urine YELLOW YELLOW   APPearance HAZY (A) CLEAR   Specific Gravity, Urine 1.012 1.005 - 1.030   pH 5.0 5.0 - 8.0   Glucose, UA NEGATIVE NEGATIVE mg/dL   Hgb urine dipstick LARGE (A) NEGATIVE   Bilirubin Urine NEGATIVE NEGATIVE   Ketones, ur NEGATIVE NEGATIVE mg/dL   Protein, ur 30 (A) NEGATIVE mg/dL   Nitrite POSITIVE (A) NEGATIVE   Leukocytes, UA LARGE (A) NEGATIVE   RBC / HPF TOO NUMEROUS TO COUNT 0 - 5 RBC/hpf   WBC, UA TOO NUMEROUS TO COUNT 0 - 5 WBC/hpf   Bacteria, UA RARE (A) NONE SEEN   Mucous PRESENT    Non Squamous Epithelial 0-5 (A) NONE SEEN  CBC with Differential/Platelet     Status:  Abnormal   Collection Time: 04/07/16 11:34 PM  Result Value Ref Range   WBC 9.6 4.0 - 10.5 K/uL   RBC 3.62 (L) 3.87 - 5.11 MIL/uL   Hemoglobin 10.8 (L) 12.0 - 15.0 g/dL   HCT 16.1 (L) 09.6 - 04.5 %   MCV 88.7 78.0 - 100.0 fL   MCH 29.8 26.0 - 34.0 pg   MCHC 33.6 30.0 - 36.0 g/dL   RDW 40.9 81.1 - 91.4 %   Platelets 229 150 - 400 K/uL   Neutrophils Relative % 80 %   Neutro Abs 7.6 1.7 - 7.7 K/uL   Lymphocytes Relative 11 %   Lymphs Abs 1.1 0.7 - 4.0 K/uL   Monocytes Relative 9 %   Monocytes Absolute 0.9 0.1 - 1.0 K/uL   Eosinophils Relative 0 %    Eosinophils Absolute 0.0 0.0 - 0.7 K/uL   Basophils Relative 0 %   Basophils Absolute 0.0 0.0 - 0.1 K/uL  Basic metabolic panel     Status: Abnormal   Collection Time: 04/07/16 11:34 PM  Result Value Ref Range   Sodium 131 (L) 135 - 145 mmol/L   Potassium 3.6 3.5 - 5.1 mmol/L   Chloride 95 (L) 101 - 111 mmol/L   CO2 27 22 - 32 mmol/L   Glucose, Bld 213 (H) 65 - 99 mg/dL   BUN 13 6 - 20 mg/dL   Creatinine, Ser 7.82 0.44 - 1.00 mg/dL   Calcium 9.4 8.9 - 95.6 mg/dL   GFR calc non Af Amer >60 >60 mL/min   GFR calc Af Amer >60 >60 mL/min   Anion gap 9 5 - 15   No results found for this or any previous visit (from the past 240 hour(s)). Creatinine:  Recent Labs  04/07/16 2334  CREATININE 0.83   Baseline Creatinine: 0.8  Impression/Assessment:  66yo with left ureteral calculus and sepsis  Plan:  1. The risks/benefits/alternatives to left ureteral stent placement was explained to the patient and she understands and wishes to proceed with surgery 2. She will be taken urgently to the OR for stent placement and then will be admitted for IV antibiotics post op.  Wilkie Aye 04/08/2016, 5:51 AM

## 2016-05-03 ENCOUNTER — Encounter (HOSPITAL_COMMUNITY): Payer: Self-pay | Admitting: Urology

## 2016-05-06 ENCOUNTER — Emergency Department (HOSPITAL_COMMUNITY)
Admission: EM | Admit: 2016-05-06 | Discharge: 2016-05-07 | Disposition: A | Discharge status: Home / Self Care | Payer: BLUE CROSS/BLUE SHIELD | Attending: Emergency Medicine | Admitting: Emergency Medicine

## 2016-05-06 ENCOUNTER — Emergency Department (HOSPITAL_COMMUNITY): Payer: BLUE CROSS/BLUE SHIELD

## 2016-05-06 ENCOUNTER — Encounter (HOSPITAL_COMMUNITY): Payer: Self-pay | Admitting: Emergency Medicine

## 2016-05-06 DIAGNOSIS — R509 Fever, unspecified: Secondary | ICD-10-CM | POA: Diagnosis present

## 2016-05-06 DIAGNOSIS — N3001 Acute cystitis with hematuria: Secondary | ICD-10-CM | POA: Insufficient documentation

## 2016-05-06 DIAGNOSIS — Z7984 Long term (current) use of oral hypoglycemic drugs: Secondary | ICD-10-CM | POA: Diagnosis not present

## 2016-05-06 DIAGNOSIS — Z7982 Long term (current) use of aspirin: Secondary | ICD-10-CM | POA: Diagnosis not present

## 2016-05-06 DIAGNOSIS — E119 Type 2 diabetes mellitus without complications: Secondary | ICD-10-CM | POA: Diagnosis not present

## 2016-05-06 DIAGNOSIS — Z79899 Other long term (current) drug therapy: Secondary | ICD-10-CM | POA: Insufficient documentation

## 2016-05-06 DIAGNOSIS — I10 Essential (primary) hypertension: Secondary | ICD-10-CM | POA: Insufficient documentation

## 2016-05-06 LAB — CBC WITH DIFFERENTIAL/PLATELET
BASOS PCT: 1 %
Basophils Absolute: 0 10*3/uL (ref 0.0–0.1)
EOS ABS: 0.3 10*3/uL (ref 0.0–0.7)
EOS PCT: 5 %
HCT: 31.3 % — ABNORMAL LOW (ref 36.0–46.0)
Hemoglobin: 10.2 g/dL — ABNORMAL LOW (ref 12.0–15.0)
Lymphocytes Relative: 20 %
Lymphs Abs: 1.2 10*3/uL (ref 0.7–4.0)
MCH: 29.4 pg (ref 26.0–34.0)
MCHC: 32.6 g/dL (ref 30.0–36.0)
MCV: 90.2 fL (ref 78.0–100.0)
Monocytes Absolute: 0.5 10*3/uL (ref 0.1–1.0)
Monocytes Relative: 9 %
Neutro Abs: 3.9 10*3/uL (ref 1.7–7.7)
Neutrophils Relative %: 65 %
PLATELETS: 232 10*3/uL (ref 150–400)
RBC: 3.47 MIL/uL — AB (ref 3.87–5.11)
RDW: 15 % (ref 11.5–15.5)
WBC: 5.9 10*3/uL (ref 4.0–10.5)

## 2016-05-06 LAB — URINALYSIS, ROUTINE W REFLEX MICROSCOPIC
BILIRUBIN URINE: NEGATIVE
Glucose, UA: NEGATIVE mg/dL
KETONES UR: NEGATIVE mg/dL
NITRITE: NEGATIVE
Protein, ur: NEGATIVE mg/dL
SPECIFIC GRAVITY, URINE: 1.01 (ref 1.005–1.030)
pH: 5 (ref 5.0–8.0)

## 2016-05-06 LAB — BASIC METABOLIC PANEL
Anion gap: 9 (ref 5–15)
BUN: 21 mg/dL — AB (ref 6–20)
CO2: 24 mmol/L (ref 22–32)
CREATININE: 1.61 mg/dL — AB (ref 0.44–1.00)
Calcium: 9.6 mg/dL (ref 8.9–10.3)
Chloride: 104 mmol/L (ref 101–111)
GFR, EST AFRICAN AMERICAN: 38 mL/min — AB (ref >60)
GFR, EST NON AFRICAN AMERICAN: 33 mL/min — AB (ref >60)
Glucose, Bld: 133 mg/dL — ABNORMAL HIGH (ref 65–99)
Potassium: 4.3 mmol/L (ref 3.5–5.1)
SODIUM: 137 mmol/L (ref 135–145)

## 2016-05-06 MED ORDER — HYDROCODONE-ACETAMINOPHEN 5-325 MG PO TABS: 1.0000 | ORAL_TABLET | Freq: Four times a day (QID) | ORAL | 0 refills | Status: DC | PRN

## 2016-05-06 MED ORDER — SODIUM CHLORIDE 0.9 % IV SOLN
INTRAVENOUS | Status: DC
  Administered 2016-05-06: 23:00:00 via INTRAVENOUS

## 2016-05-06 MED ORDER — CEPHALEXIN 500 MG PO CAPS: 500.0000 mg | ORAL_CAPSULE | Freq: Four times a day (QID) | ORAL | 0 refills | Status: DC

## 2016-05-06 MED ORDER — DEXTROSE 5 % IV SOLN
1.0000 g | Freq: Once | INTRAVENOUS | Status: AC
  Administered 2016-05-06: 1 g via INTRAVENOUS
  Filled 2016-05-06: qty 10

## 2016-05-06 MED ORDER — SODIUM CHLORIDE 0.9 % IV BOLUS (SEPSIS)
500.0000 mL | Freq: Once | INTRAVENOUS | Status: AC
  Administered 2016-05-06: 500 mL via INTRAVENOUS

## 2016-05-06 NOTE — Discharge Instructions (Signed)
Follow-up with urology on Monday call and have them review your lab results from today. Take the antibiotics as directed. Return for any new or worse symptoms like fevers worse pain persistent vomiting. CT scan shows no retained stone but does show inflammation of the left ureter which may not be too unusual based on the fact that a stent was just removed. Urine culture pending. Renal function is low bit worse than usual. This will require close follow-up.

## 2016-05-06 NOTE — ED Provider Notes (Signed)
[facility] Provider Note   CSN: [id number] Arrival date & time: 05/06/16  4782   By signing my name below, I, Clarisse Gouge, attest that this documentation has been prepared under the direction and in the presence of Vanetta Mulders, MD. Electronically signed, Clarisse Gouge, ED Scribe. 05/06/16. 10:00 PM.   History   Chief Complaint Chief Complaint  Patient presents with  . Flank Pain    L flank pain since Weds   The history is provided by the patient and medical records. No language interpreter was used.    Isabella Stewart is a 66 y.o. female with h/o sepsis, kidney stones, renal disorder, DM and renal stent placement, transported via EMS  to the Emergency Department with concern for worsening, L flank pain x 3 days. Associated chills, nausea noted. She states she was seen 05/09/2016 for removal of kidney stones though the performing provider was unable to locate any and she was discharged at this time. She notes she had a renal stent replaced at this time, which was removed two days later. She notes F/U appointment on 05/11/2016. No visual changes, cold symptoms, chest pain, SOB, abdominal pain, dysuria, hematuria, leg swelling, anticoagulant use, rash, headache or any other complaints at this time.   Past Medical History:  Diagnosis Date  . Diabetes mellitus without complication (HCC)   . Heart murmur   . History of kidney stones   . Hypertension   . Renal disorder    kidney stones    Patient Active Problem List   Diagnosis Date Noted  . Ureteral calculus 04/08/2016    Past Surgical History:  Procedure Laterality Date  . BACK SURGERY    . CYSTOSCOPY W/ URETERAL STENT PLACEMENT Left 04/08/2016   Procedure: CYSTOSCOPY WITH RETROGRADE PYELOGRAM/URETERAL STENT PLACEMENT;  Surgeon: Malen Gauze, MD;  Location: [facility];  Service: Urology;  Laterality: Left;  . CYSTOSCOPY WITH RETROGRADE PYELOGRAM, URETEROSCOPY AND STENT PLACEMENT Left 05/02/2016   Procedure:  CYSTOSCOPY WITH RETROGRADE PYELOGRAM, URETEROSCOPY AND STENT REPLACEMENT;  Surgeon: Malen Gauze, MD;  Location: [facility];  Service: Urology;  Laterality: Left;  1 HR [phone] [id number]    OB History    No data available       Home Medications    Prior to Admission medications   Medication Sig Start Date End Date Taking? Authorizing Provider  aspirin EC 81 MG tablet Take 81 mg by mouth daily.   Yes Historical Provider, MD  atorvastatin (LIPITOR) 10 MG tablet Take 10 mg by mouth daily.    Yes Historical Provider, MD  b complex vitamins capsule Take 1 capsule by mouth 2 (two) times daily. Supplement contains Alpha Lipoic Acid   Yes Historical Provider, MD  chlorthalidone (HYGROTON) 25 MG tablet Take 25 mg by mouth daily.   Yes Historical Provider, MD  ergocalciferol (VITAMIN D2) 50000 units capsule Take 50,000 Units by mouth 2 (two) times a week. Wednesday and Sundays   Yes Historical Provider, MD  fosinopril (MONOPRIL) 40 MG tablet Take 40 mg by mouth daily.   Yes Historical Provider, MD  glipiZIDE (GLUCOTROL XL) 10 MG 24 hr tablet Take 10 mg by mouth daily with breakfast.   Yes Historical Provider, MD  metFORMIN (GLUCOPHAGE) 500 MG tablet Take 500 mg by mouth 2 (two) times daily with a meal.   Yes Historical Provider, MD  multivitamin-lutein (OCUVITE-LUTEIN) CAPS capsule Take 1 capsule by mouth daily.   Yes Historical Provider, MD  naproxen sodium (ANAPROX) 220 MG tablet Take 220  mg by mouth daily as needed (pain).   Yes Historical Provider, MD  oxyCODONE-acetaminophen (PERCOCET/ROXICET) 5-325 MG tablet Take 1 tablet by mouth every 4 (four) hours as needed for moderate pain. 05/02/16  Yes Malen Gauze, MD  pioglitazone (ACTOS) 30 MG tablet Take 30 mg by mouth daily.   Yes Historical Provider, MD  cephALEXin (KEFLEX) 500 MG capsule Take 1 capsule (500 mg total) by mouth 4 (four) times daily. 05/06/16   Vanetta Mulders, MD  HYDROcodone-acetaminophen (NORCO/VICODIN) 5-325  MG tablet Take 1-2 tablets by mouth every 6 (six) hours as needed. 05/06/16   Vanetta Mulders, MD  sulfamethoxazole-trimethoprim (BACTRIM DS,SEPTRA DS) 800-160 MG tablet Take 1 tablet by mouth 2 (two) times daily. Patient not taking: Reported on 05/06/2016 05/02/16   Malen Gauze, MD    Family History No family history on file.  Social History Social History  Substance Use Topics  . Smoking status: Never Smoker  . Smokeless tobacco: Never Used  . Alcohol use No     Allergies   Latex and Premarin [conjugated estrogens]   Review of Systems Review of Systems  Constitutional: Positive for chills and fever.  HENT: Negative for congestion, rhinorrhea and sore throat.   Eyes: Negative for visual disturbance.  Respiratory: Negative for cough and shortness of breath.   Cardiovascular: Negative for chest pain and leg swelling.  Gastrointestinal: Positive for nausea. Negative for abdominal pain, diarrhea and vomiting.  Genitourinary: Negative for dysuria and hematuria.  Musculoskeletal: Positive for back pain.  Skin: Negative for rash.  Neurological: Negative for headaches.  Hematological: Does not bruise/bleed easily.  Psychiatric/Behavioral: Negative for confusion.     Physical Exam Updated Vital Signs BP (!) 158/94 (BP Location: Left Arm)   Pulse (!) 102   Temp 98.4 F (36.9 C) (Oral)   Resp 18   Ht  (1.575 m)   Wt 180 lb (81.6 kg)   SpO2 99%   BMI 32.92 kg/m   Physical Exam  Constitutional: She appears well-developed and well-nourished. No distress.  HENT:  Head: Normocephalic.  Mouth/Throat: Oropharynx is clear and moist and mucous membranes are normal.  Eyes: Conjunctivae and EOM are normal. Pupils are equal, round, and reactive to light. No scleral icterus.  Neck: Neck supple.  Cardiovascular: Normal rate and regular rhythm.   Pulmonary/Chest: Effort normal and breath sounds normal. No respiratory distress. She has no wheezes. She has no rales.    Abdominal: Soft. Bowel sounds are normal. There is no tenderness. There is no rebound, no guarding and no CVA tenderness.  Negative flank pain  Musculoskeletal: Normal range of motion. She exhibits no edema.  Negative ankle swelling  Neurological: She is alert. No cranial nerve deficit or sensory deficit. She exhibits normal muscle tone. Coordination normal.  Skin: Skin is warm and dry.  Psychiatric: She has a normal mood and affect.  Nursing note and vitals reviewed.    ED Treatments / Results  DIAGNOSTIC STUDIES: Oxygen Saturation is 99% on RA, NL by my interpretation.    COORDINATION OF CARE: 9:59 PM-Discussed next steps with pt. Pt verbalized understanding and is agreeable with the plan. Will order medications, imaging and labs. Will Rx medications.   Labs (all labs ordered are listed, but only abnormal results are displayed) Labs Reviewed  URINALYSIS, ROUTINE W REFLEX MICROSCOPIC - Abnormal; Notable for the following:       Result Value   Hgb urine dipstick MODERATE (*)    Leukocytes, UA MODERATE (*)  Bacteria, UA RARE (*)    Squamous Epithelial / LPF 0-5 (*)    All other components within normal limits  CBC WITH DIFFERENTIAL/PLATELET - Abnormal; Notable for the following:    RBC 3.47 (*)    Hemoglobin 10.2 (*)    HCT 31.3 (*)    All other components within normal limits  BASIC METABOLIC PANEL - Abnormal; Notable for the following:    Glucose, Bld 133 (*)    BUN 21 (*)    Creatinine, Ser 1.61 (*)    GFR calc non Af Amer 33 (*)    GFR calc Af Amer 38 (*)    All other components within normal limits  URINE CULTURE    EKG  EKG Interpretation None       Radiology Ct Renal Stone Study  Result Date: 05/06/2016 CLINICAL DATA:  Acute onset of left flank pain and chills. Initial encounter. EXAM: CT ABDOMEN AND PELVIS WITHOUT CONTRAST TECHNIQUE: Multidetector CT imaging of the abdomen and pelvis was performed following the standard protocol without IV contrast.  COMPARISON:  CT of the abdomen and pelvis from 04/08/2016 FINDINGS: Lower chest: Minimal bibasilar atelectasis is noted. The visualized portions of the mediastinum are grossly unremarkable. Hepatobiliary: The liver is unremarkable in appearance. The gallbladder is unremarkable in appearance. The common bile duct remains normal in caliber. Pancreas: The pancreas is within normal limits. Spleen: The spleen is unremarkable in appearance. Adrenals/Urinary Tract: The adrenal glands are unremarkable in appearance. Mild left-sided hydronephrosis is noted, with left-sided perinephric stranding and fluid. There is prominence of the left ureter along its entire course, without evidence of a distal obstructing stone. Vaguely increased attenuation within the left ureter raises concern for underlying ureteritis. The right kidney is unremarkable in appearance. No nonobstructing renal stone is identified. Stomach/Bowel: The stomach is unremarkable in appearance. The small bowel is within normal limits. The appendix is normal in caliber, without evidence of appendicitis. The colon is unremarkable in appearance. There is mild diverticulosis along the distal transverse, descending and sigmoid colon, without evidence of diverticulitis. Vascular/Lymphatic: Scattered calcification is seen along the abdominal aorta and its branches. The abdominal aorta is otherwise grossly unremarkable. The inferior vena cava is grossly unremarkable. No retroperitoneal lymphadenopathy is seen. No pelvic sidewall lymphadenopathy is identified. Reproductive: The bladder is mildly distended and within normal limits. The uterus is grossly unremarkable in appearance. The ovaries are relatively symmetric. No suspicious adnexal masses are seen. Other: No additional soft tissue abnormalities are seen. Musculoskeletal: No acute osseous abnormalities are identified. There is mild grade 1 anterolisthesis of L4 on L5, with underlying facet disease. Vacuum phenomenon  is noted at L4-L5. The visualized musculature is unremarkable in appearance. IMPRESSION: 1. Mild left-sided hydronephrosis, with left-sided perinephric stranding and fluid. Prominence of the left ureter, without evidence of a distal obstructing stone. Vaguely increased attenuation within the left ureter raises concern for underlying ureteritis. 2. Scattered aortic atherosclerosis. 3. Mild diverticulosis along the distal transverse, descending and sigmoid colon, without evidence of diverticulitis. Electronically Signed   By: Roanna Raider M.D.   On: 05/06/2016 22:57    Procedures Procedures (including critical care time)  Medications Ordered in ED Medications  0.9 %  sodium chloride infusion ( Intravenous New Bag/Given 05/06/16 2235)  sodium chloride 0.9 % bolus 500 mL (500 mLs Intravenous New Bag/Given 05/06/16 2236)  cefTRIAXone (ROCEPHIN) 1 g in dextrose 5 % 50 mL IVPB (1 g Intravenous New Bag/Given 05/06/16 2236)     Initial Impression / Assessment and  Plan / ED Course  I have reviewed the triage vital signs and the nursing notes.  Pertinent labs & imaging results that were available during my care of the patient were reviewed by me and considered in my medical decision making (see chart for details).    Patient followed by Dr. Ronne Binning urology. Status post stent removal on Wednesday. That evening patient started to have increased pain in the left flank left CVA area. Patient is felt as if maybe there is been a fever but not documented. Also with some chills. Patient is worried about recurrent infection she's had that in the past. No stones were found by urology on their evaluation on Monday.  Here today no significant leukocytosis no fevers. Urine was sent for culture. Patient given 1 g of Rocephin. Patient will be continued on Keflex. Urine cultures will dictate whether his infection or not. Renal function is worse with a creatinine of 1.6. Rest of electrolytes are normal. CT renal study  shows inflammation of the left ureter but no stones and no significant obstruction.  Patient will require close follow-up with your urologist. Maryclare Labrador have him call on Monday. She'll return for any new or worse symptoms.   Final Clinical Impressions(s) / ED Diagnoses   Final diagnoses:  Acute cystitis with hematuria    New Prescriptions New Prescriptions   CEPHALEXIN (KEFLEX) 500 MG CAPSULE    Take 1 capsule (500 mg total) by mouth 4 (four) times daily.   HYDROCODONE-ACETAMINOPHEN (NORCO/VICODIN) 5-325 MG TABLET    Take 1-2 tablets by mouth every 6 (six) hours as needed.  I personally performed the services described in this documentation, which was scribed in my presence. The recorded information has been reviewed and is accurate.      Vanetta Mulders, MD 05/06/16 2352

## 2016-05-06 NOTE — ED Triage Notes (Signed)
Pt recently had procedure to remove kidney stone.  Pt states the doctor was unable to locate kidney stone and she was discharged.  Having left flank pain that is intermittently worse with chills.  Pt has history of sepsis with kidney stones and is concerned about infection

## 2016-05-08 LAB — URINE CULTURE: Culture: <10000 — AB

## 2016-05-11 ENCOUNTER — Ambulatory Visit (INDEPENDENT_AMBULATORY_CARE_PROVIDER_SITE_OTHER): Payer: BLUE CROSS/BLUE SHIELD | Admitting: Urology

## 2016-05-11 DIAGNOSIS — N201 Calculus of ureter: Secondary | ICD-10-CM | POA: Diagnosis not present

## 2016-05-12 ENCOUNTER — Other Ambulatory Visit: Payer: Self-pay | Admitting: Urology

## 2016-05-12 DIAGNOSIS — N201 Calculus of ureter: Secondary | ICD-10-CM

## 2016-05-18 NOTE — Op Note (Signed)
Preoperative diagnosis: Left hydronephrosis  Postoperative diagnosis: Same  Procedure: 1 cystoscopy 2.  left retrograde pyelography 3.  Intraoperative fluoroscopy, under one hour, with interpretation 4.  Left diagnostic ureteroscopy 5. Left 6x24 JJ ureteral stent exchange  Attending: Cleda Mccreedy  Anesthesia: General  Estimated blood loss: None  Drains: left 6x24 JJ ureteral stent with tether  Specimens: none  Antibiotics: rocephin  Findings: No stone located in the ureter.  Indications: Patient is a 66 year old female with a history of left ureteral calculus and sepsis. She had a stent placed for a 5mm ureteral stone with sepsis.  After discussing treatment options, she decided proceed with left diagnostic ureteroscopy  Procedure her in detail: The patient was brought to the operating room and a brief timeout was done to ensure correct patient, correct procedure, correct site.  General anesthesia was administered patient was placed in dorsal lithotomy position.  Her genitalia was then prepped and draped in usual sterile fashion.  A rigid 22 French cystoscope was passed in the urethra and the bladder.  Bladder was inspected free masses or lesions.  the right ureteral orifices were in the normal orthotopic locations. Using a grasper the stent was brought to the urethral meatus. A zipwire was then advanced through the the stent up to the renal pelvis. The stent was then removed.  a 6 french ureteral catheter was then instilled into the left ureter orifice.  a gentle retrograde was obtained and findings noted above.  we then removed the cystoscope and cannulated the left ureteral orifice with a semirigid ureteroscope.  we then performed ureteroscopy up to the level of the UPJ. No stone or tumor was encountered. We then advanced a sensor wire into the renal pelvis. We then removed the scope and advanced a flexeble ureteroscope over the wire and up to the renal pelvis. We performed  nephroscopy and located no stone. We then removed the scope and placed a 6x24 JJ ureteral stent over the original zipwire. We removed the wire and good coil was noted in the renal pelvis under fluoroscopy and bladder under direct vision. the bladder was then drained and this concluded the procedure which was well tolerated by patient.  Complications: None  Condition: Stable, extubated, transferred to PACU  Plan: Pt is to followup in 2 weeks. She is to remove her stent by pulling the tether in 3 days

## 2016-06-15 ENCOUNTER — Ambulatory Visit (HOSPITAL_COMMUNITY)
Admission: RE | Admit: 2016-06-15 | Discharge: 2016-06-15 | Disposition: A | Discharge status: Home / Self Care | Payer: BLUE CROSS/BLUE SHIELD | Source: Ambulatory Visit | Attending: Urology | Admitting: Urology

## 2016-06-15 DIAGNOSIS — Z87442 Personal history of urinary calculi: Secondary | ICD-10-CM | POA: Insufficient documentation

## 2016-06-15 DIAGNOSIS — N201 Calculus of ureter: Secondary | ICD-10-CM | POA: Diagnosis present

## 2016-06-22 ENCOUNTER — Ambulatory Visit (INDEPENDENT_AMBULATORY_CARE_PROVIDER_SITE_OTHER): Payer: BLUE CROSS/BLUE SHIELD | Admitting: Urology

## 2016-06-22 DIAGNOSIS — N201 Calculus of ureter: Secondary | ICD-10-CM | POA: Diagnosis not present

## 2016-09-07 NOTE — Addendum Note (Signed)
Addendum  created 09/07/16 1350 by Rether Rison, MD   Sign clinical note    

## 2016-11-29 ENCOUNTER — Other Ambulatory Visit: Payer: Self-pay | Admitting: Urology

## 2016-11-29 DIAGNOSIS — N201 Calculus of ureter: Secondary | ICD-10-CM

## 2017-12-11 IMAGING — CT CT RENAL STONE PROTOCOL
2 of 4 series · 16 of 46 positions shown, 18 images · non-contrast
Comparison: None.

CLINICAL DATA: 66-year-old female with left flank pain.

EXAM:
CT ABDOMEN AND PELVIS WITHOUT CONTRAST
TECHNIQUE: Multidetector CT imaging of the abdomen and pelvis was performed
following the standard protocol without IV contrast.

[Series 2: axial st · axial · 0.93mm/px · z∈[-381,+14]mm · 13 of 87 slices shown, 15 images]
[im 4/87  soft-tissue]
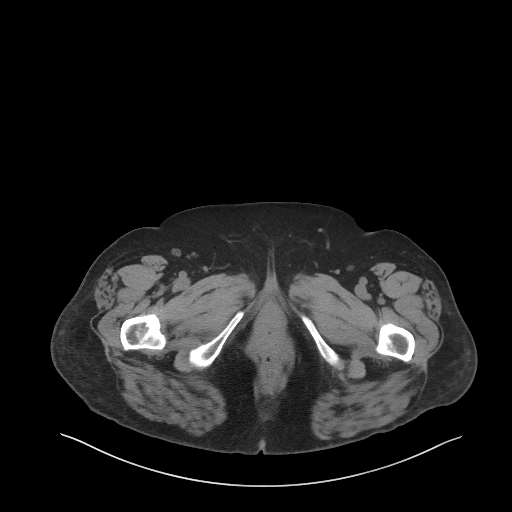
[im 4/87  bone]
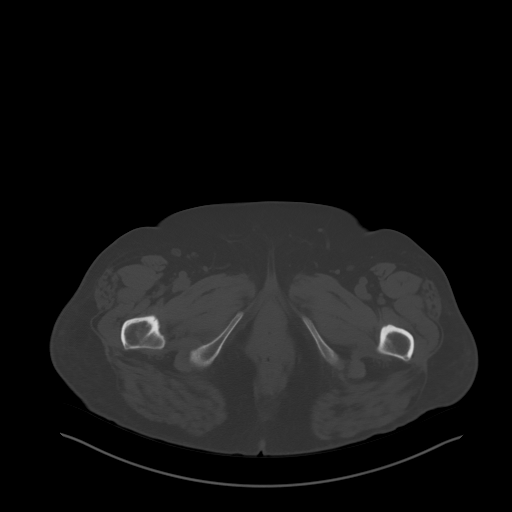
[im 11/87  soft-tissue]
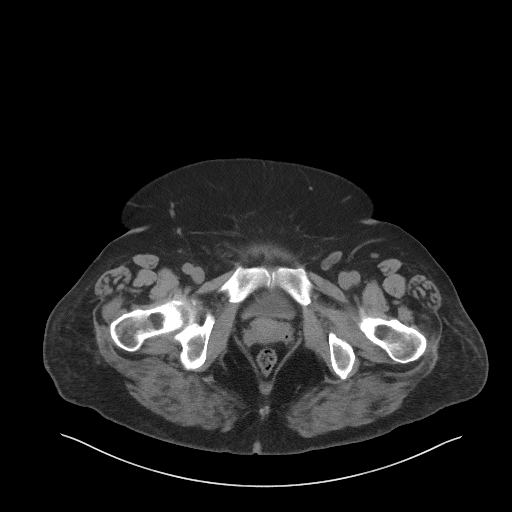
[im 18/87  soft-tissue]
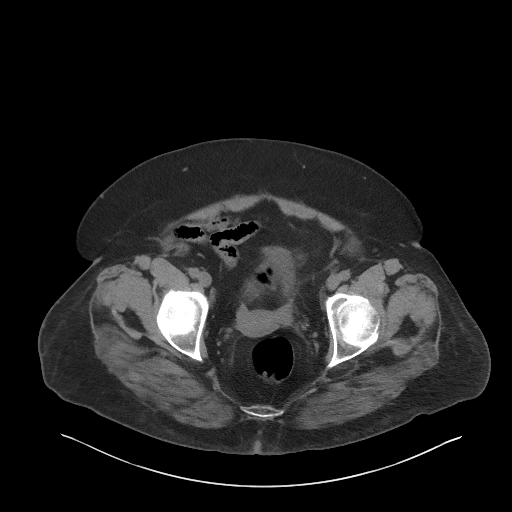
[im 26/87  soft-tissue]
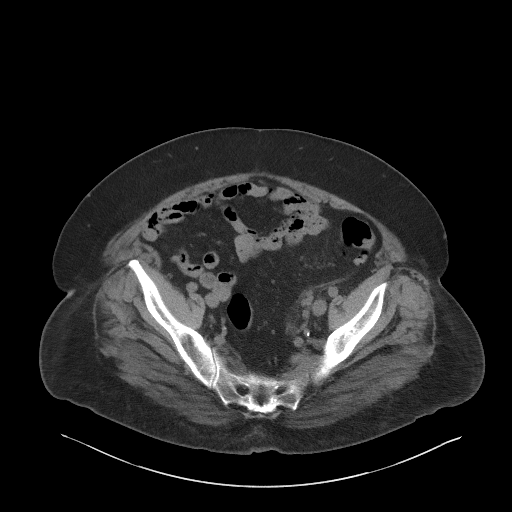
[im 29/87  soft-tissue]
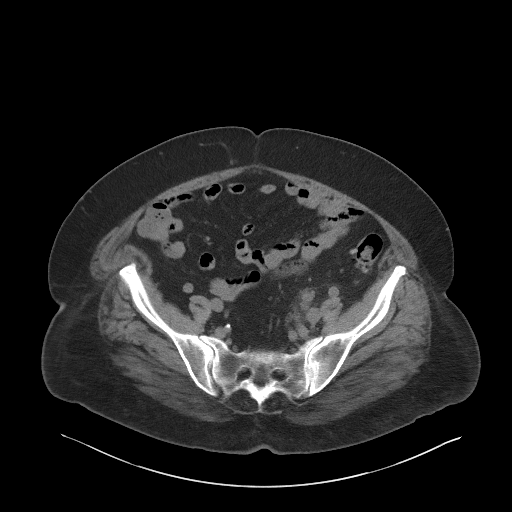
[im 36/87  soft-tissue]
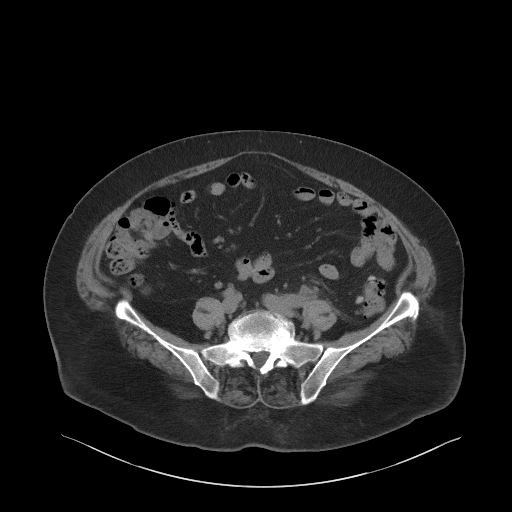
[im 44/87  soft-tissue]
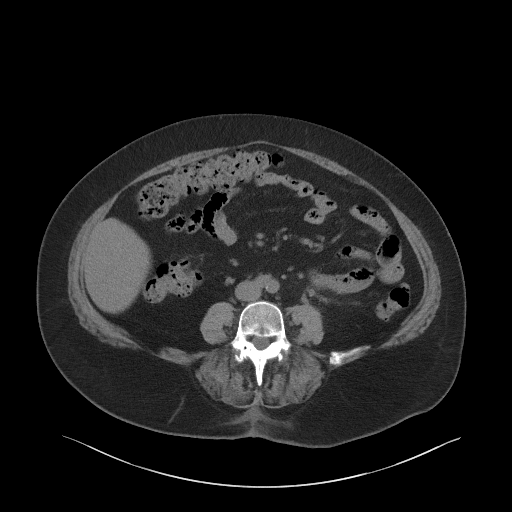
[im 51/87  soft-tissue]
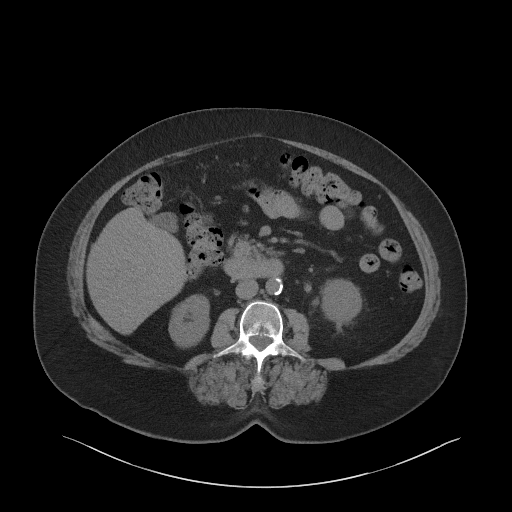
[im 58/87  soft-tissue]
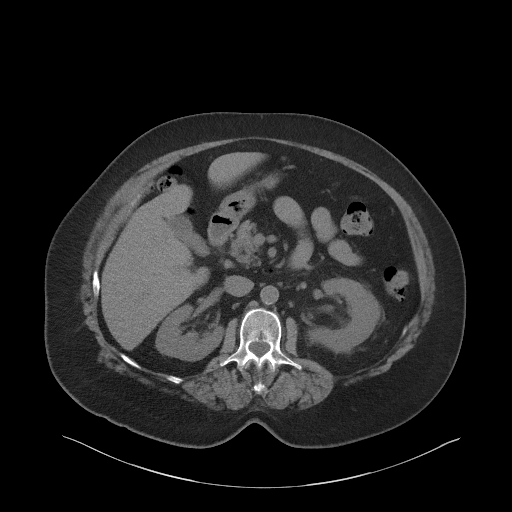
[im 58/87  bone]
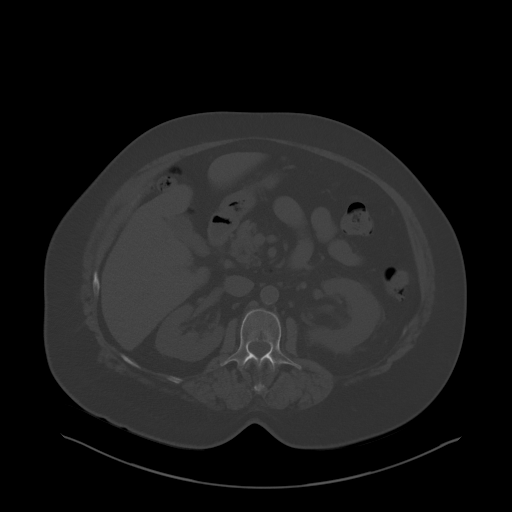
[im 61/87  soft-tissue]
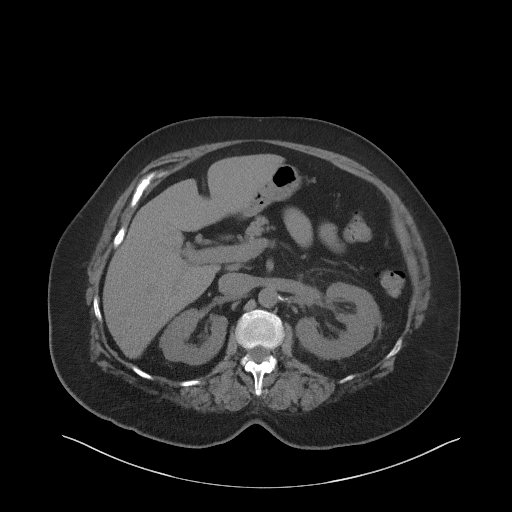
[im 69/87  soft-tissue]
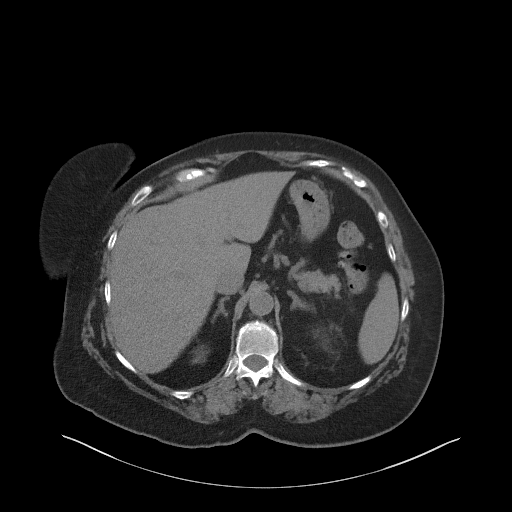
[im 76/87  soft-tissue]
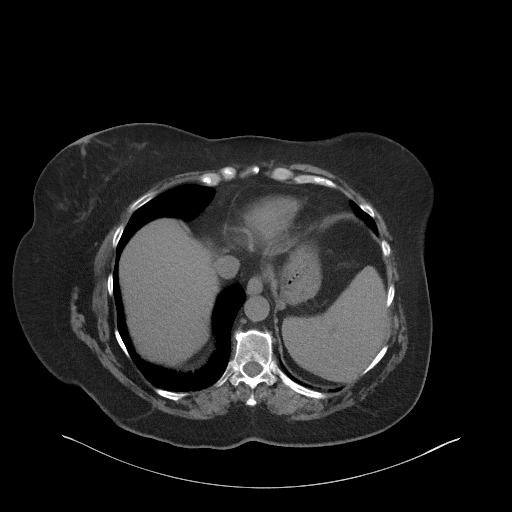
[im 83/87  soft-tissue]
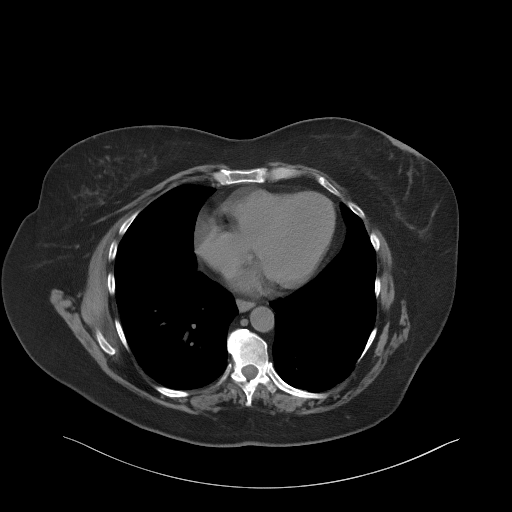

[Series 5: coronal st · coronal · 0.84mm/px · 3 of 101 slices shown]
[im 34/101  soft-tissue]
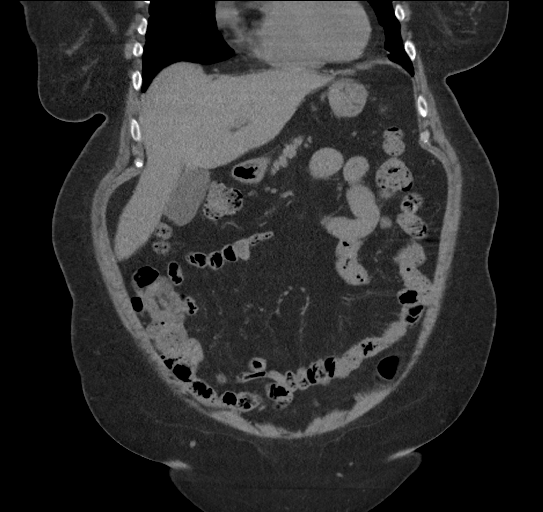
[im 45/101  soft-tissue]
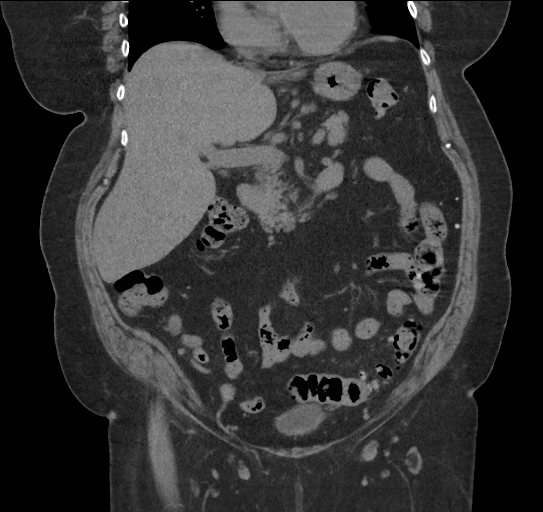
[im 56/101  soft-tissue]
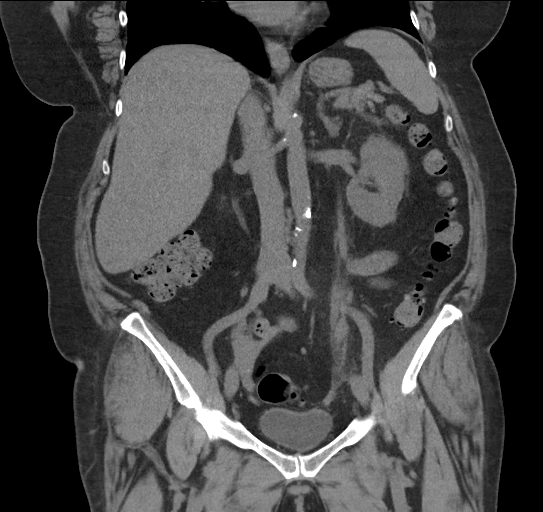

[16 of 46 positions shown; findings below may reference images not displayed]

FINDINGS: Evaluation of this exam is limited in the absence of intravenous
contrast.

Lower chest: The visualized lung bases are clear.

No intra-abdominal free air or free fluid.

Hepatobiliary: No focal liver abnormality is seen. No gallstones,
gallbladder wall thickening, or biliary dilatation.

Pancreas: Unremarkable. No pancreatic ductal dilatation or
surrounding inflammatory changes.

Spleen: Normal in size without focal abnormality.

Adrenals/Urinary Tract: The adrenal glands are unremarkable. There
is a 5 mm distal left ureteral stone with mild left hydronephrosis.
Correlation with urinalysis recommended to exclude superimposed UTI.
The right kidney, right ureter, and urinary bladder appear
unremarkable.

Stomach/Bowel: There is sigmoid diverticulosis without active
inflammatory changes. Constipation. No bowel obstruction or active
inflammation. Normal appendix. A 12 mm distal duodenal diverticulum
noted.

Vascular/Lymphatic: Mild aortoiliac atherosclerotic disease. No
aneurysmal dilatation. The IVC is grossly unremarkable. No portal
venous gas identified. Evaluation of the vasculature is limited in
the absence of intravenous contrast. There is no adenopathy.

Reproductive: The uterus and ovaries are grossly unremarkable.

Other: None

Musculoskeletal: Degenerative changes of the spine. L4-L5 disc
desiccation with vacuum phenomenon. L4-L5 facet arthropathy and
vacuum phenomena. No acute fracture.
IMPRESSION: 1. A 5 mm distal left ureteral stone with mild left hydronephrosis.
2. Constipation. No bowel obstruction or active inflammation. Normal
appendix.
3. Sigmoid diverticulosis.

## 2018-01-04 IMAGING — RF DG RETROGRADE PYELOGRAM
1 series · 5 of 5 positions shown · non-contrast
Comparison: None.

CLINICAL DATA: Stent exchange

EXAM:
INTRAOPERATIVE LEFT RETROGRADE UROGRAPHY
TECHNIQUE: Images were obtained with the C-arm fluoroscopic device
intraoperatively and submitted for interpretation post-operatively.
Please see the procedural report for the amount of contrast and the
fluoroscopy time utilized.

[Series 1: run · 5 of 5 slices shown]
[im 1/5]
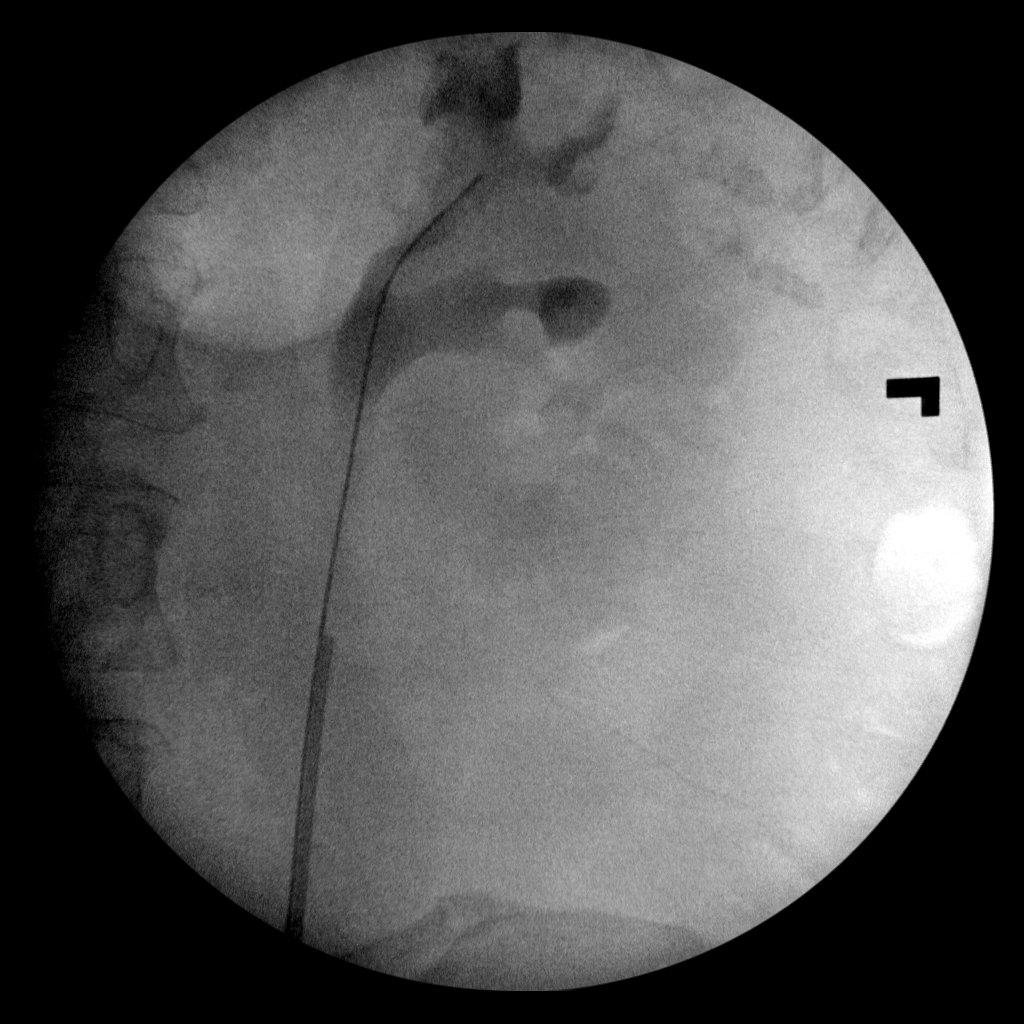
[im 2/5]
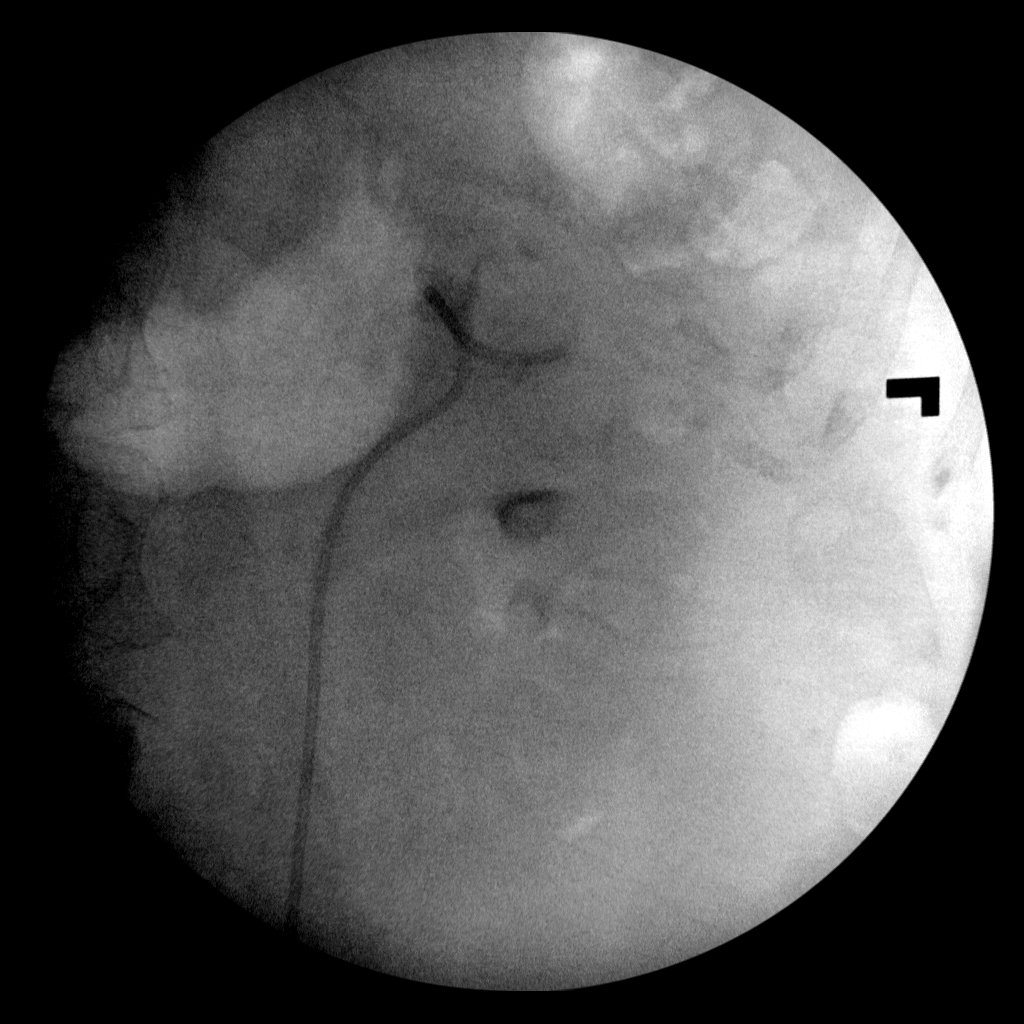
[im 3/5]
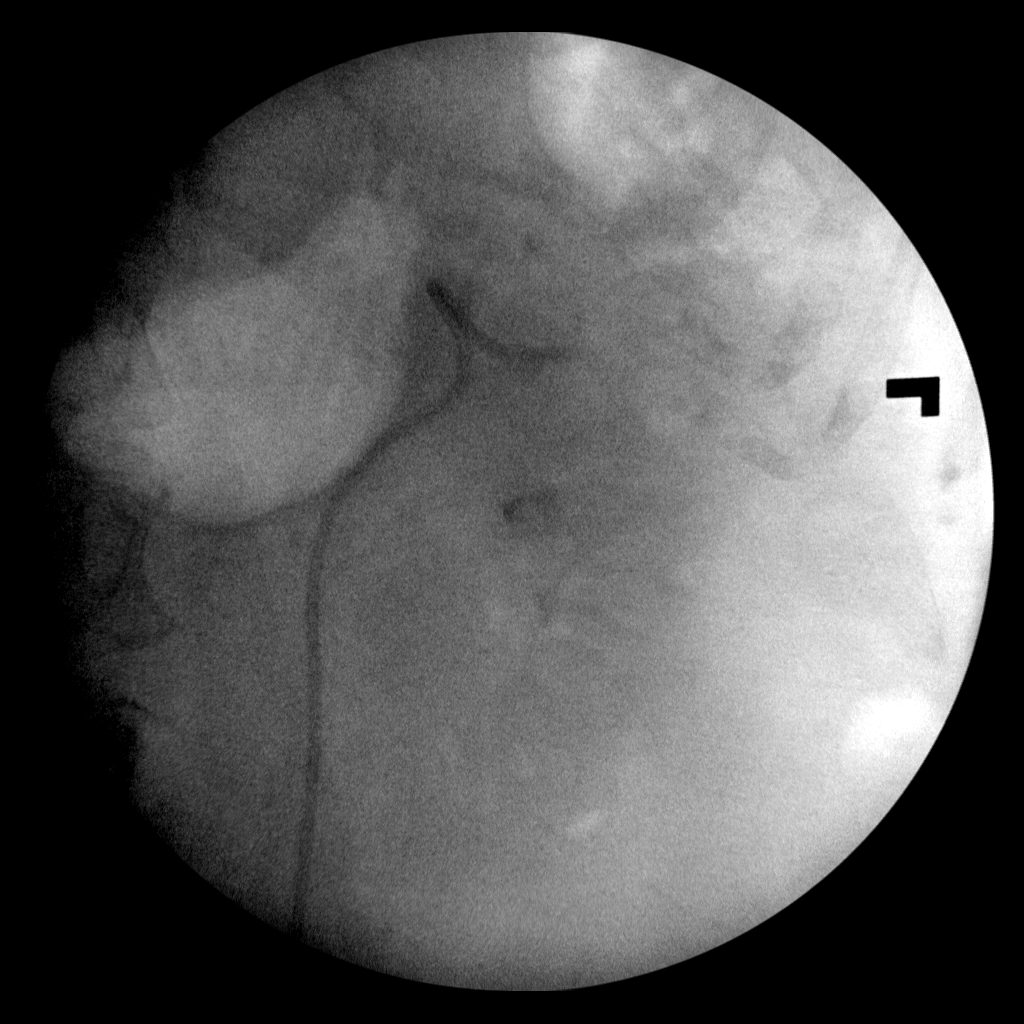
[im 4/5]
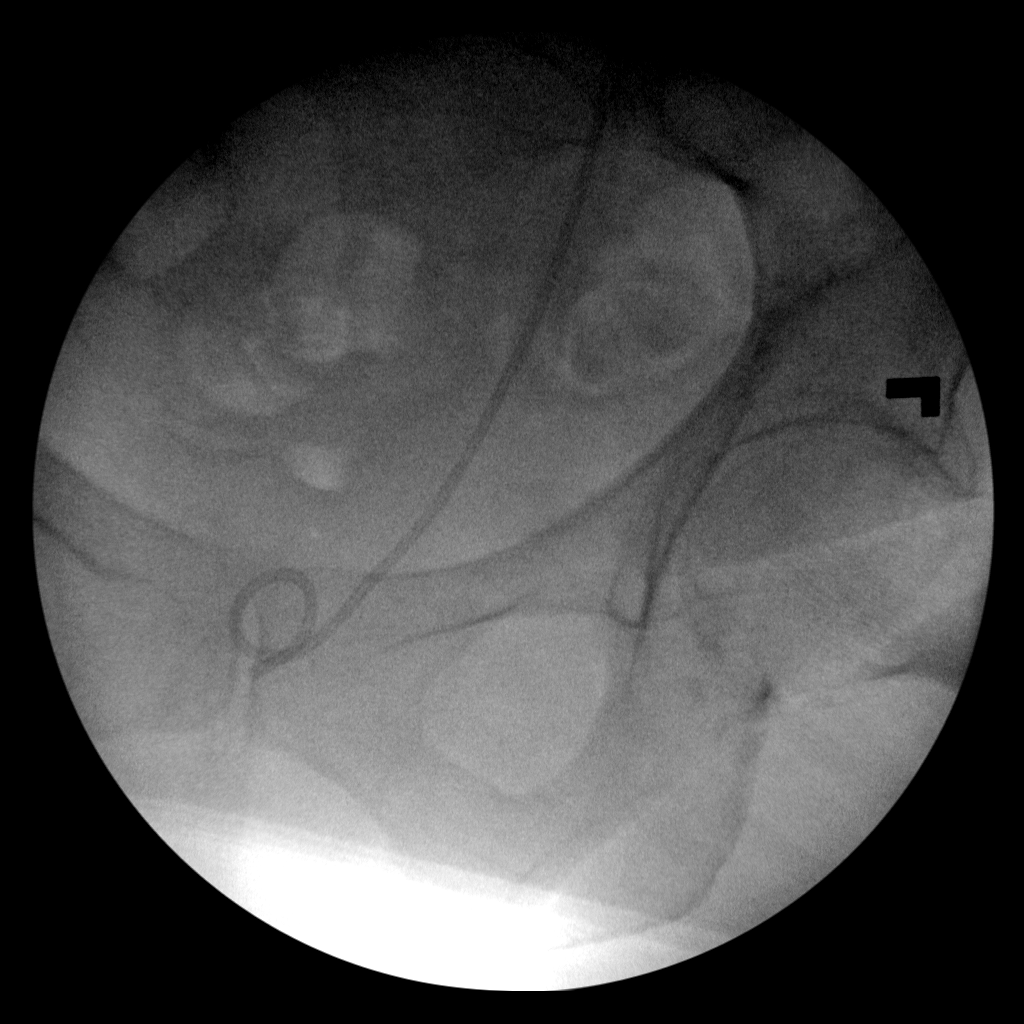
[im 5/5]
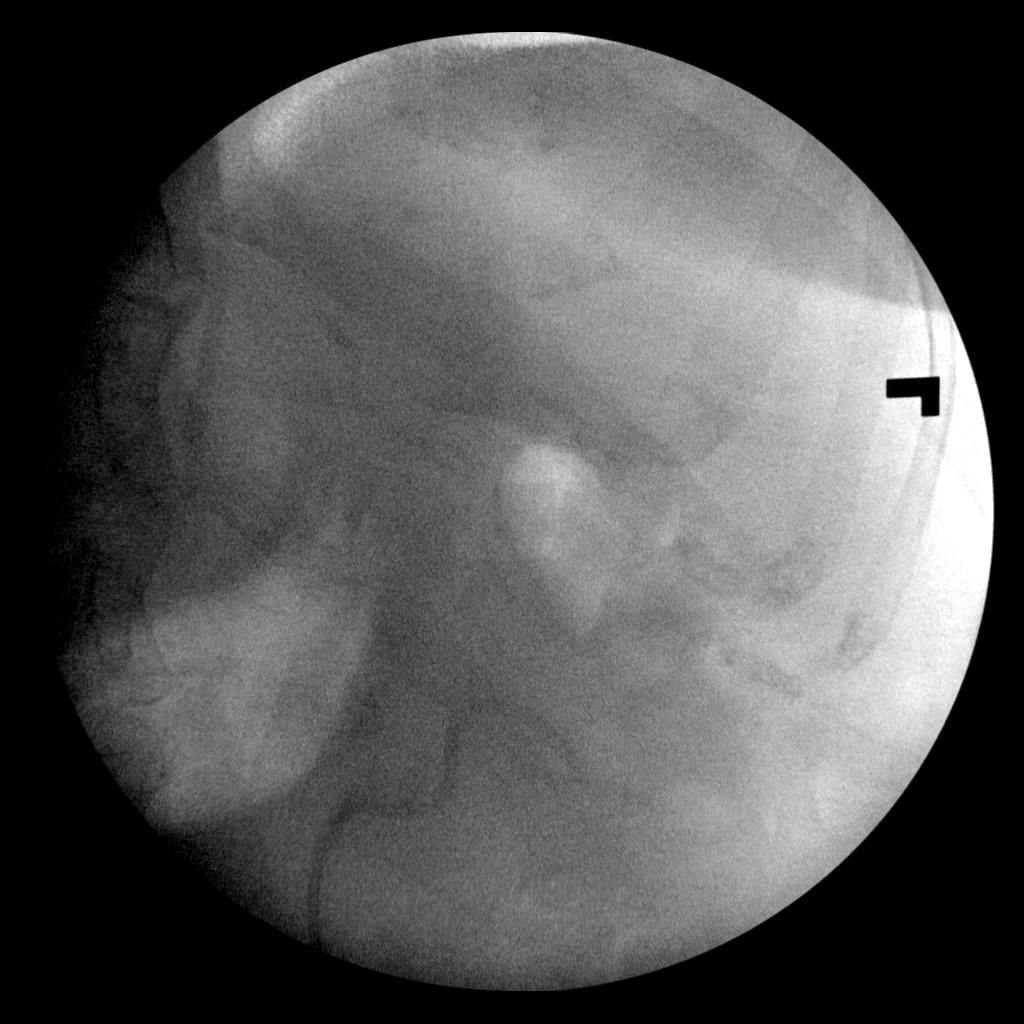

[5 of 5 positions shown; findings below may reference images not displayed]

FINDINGS: Wire a access to the left renal collecting system upper pole is
documented. A left ureteral stent has been replaced extending from
the left kidney to the bladder.
IMPRESSION: See above.

## 2018-09-10 IMAGING — US US RENAL
1 series · 14 of 25 positions shown · non-contrast
Comparison: None.

CLINICAL DATA: Follow-up ureteral calculus.

EXAM:
RENAL / URINARY TRACT ULTRASOUND COMPLETE

[Series 1: us renal · 0.26mm/px · 14 of 52 slices shown]
[im 1/52]
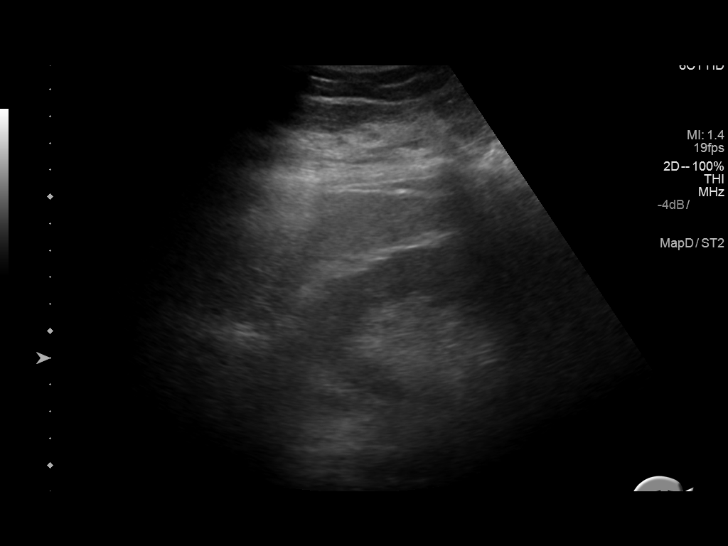
[im 5/52]
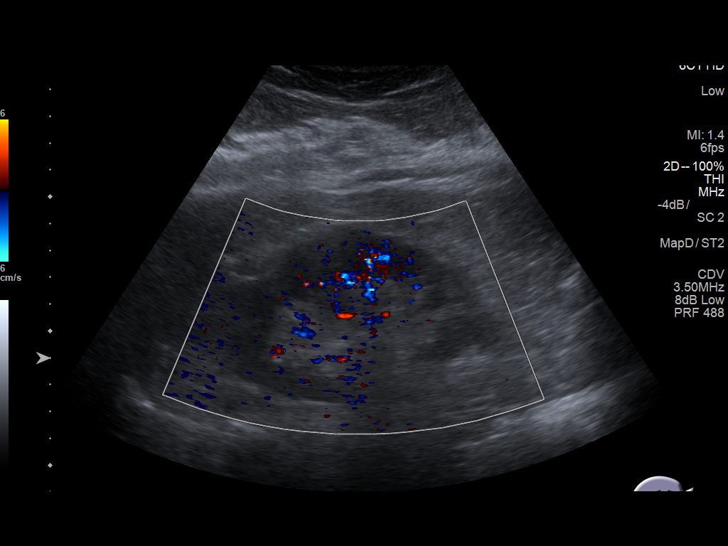
[im 9/52]
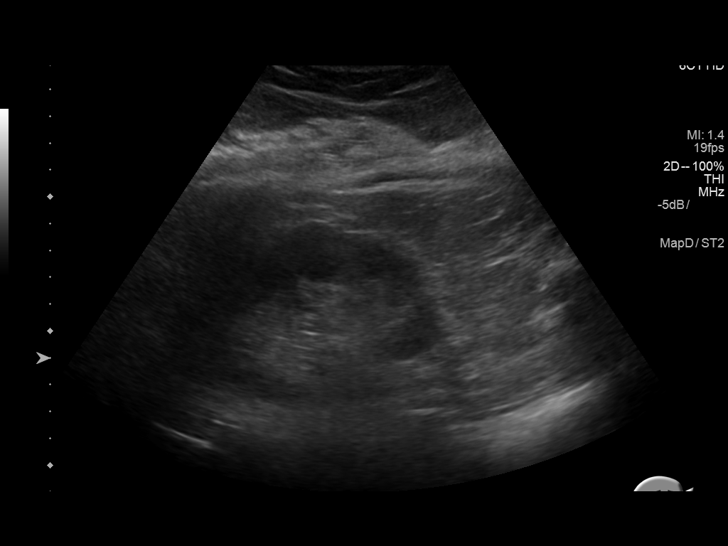
[im 13/52]
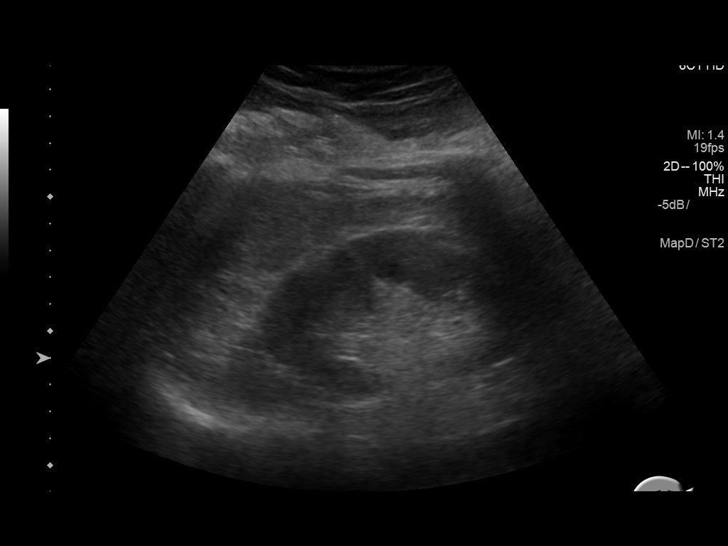
[im 18/52]
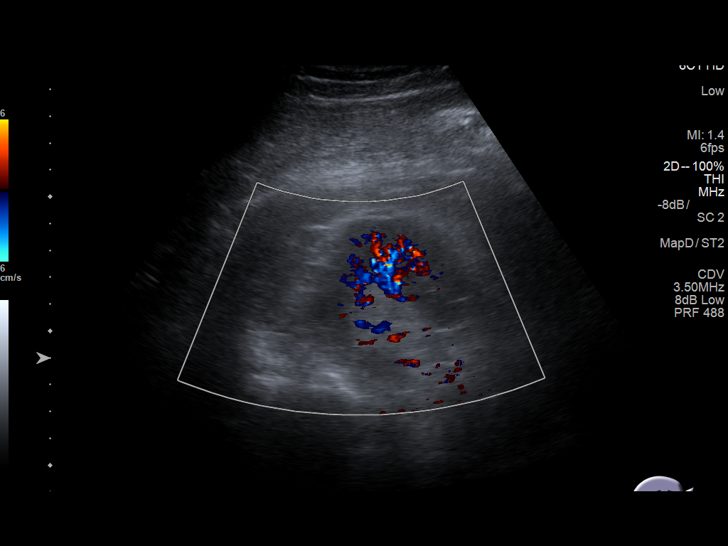
[im 20/52]
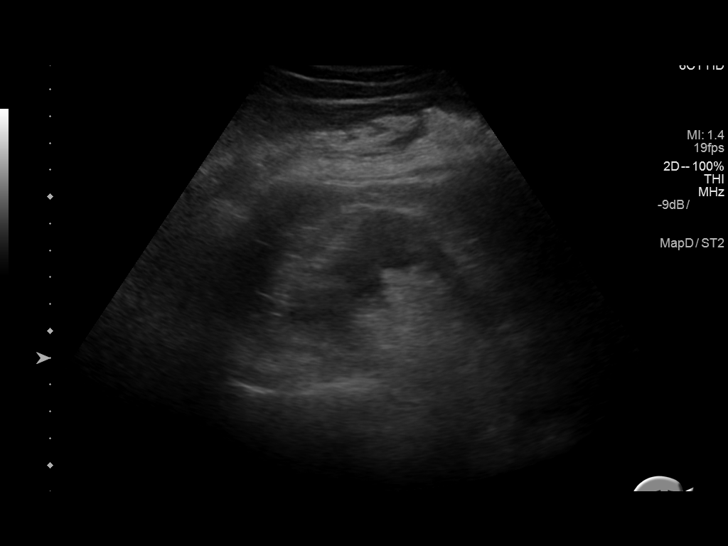
[im 24/52]
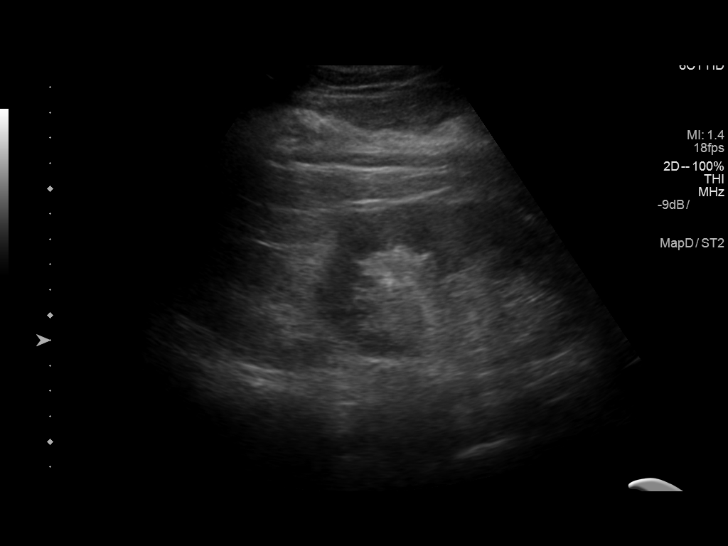
[im 28/52]
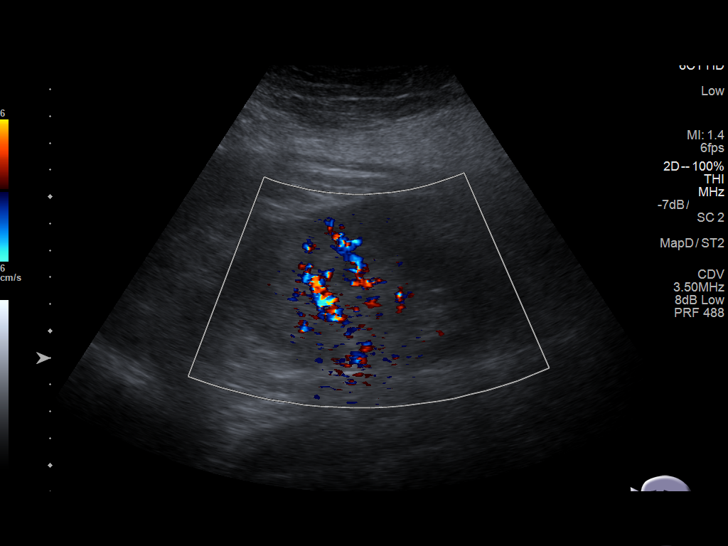
[im 32/52]
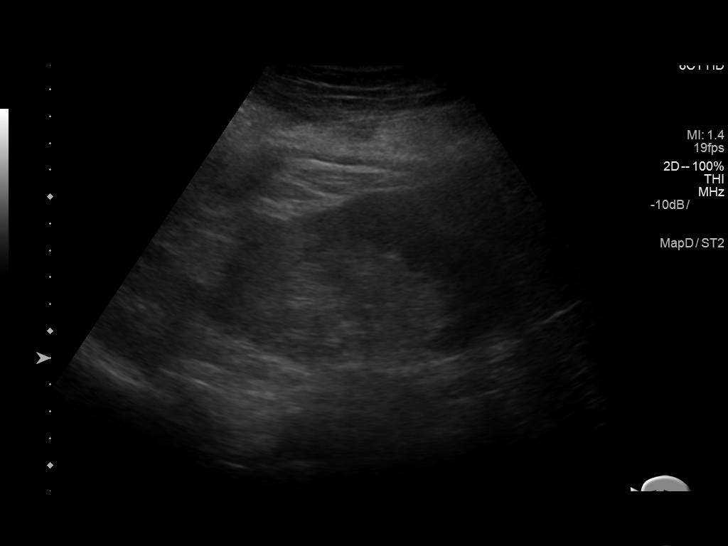
[im 35/52]
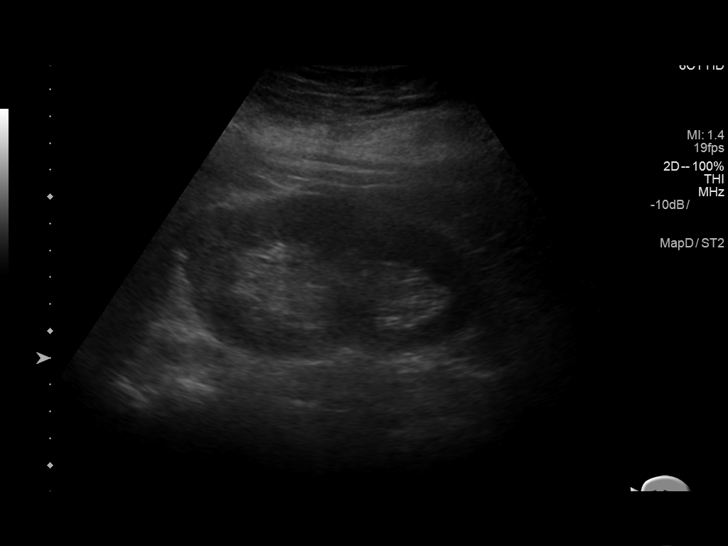
[im 39/52]
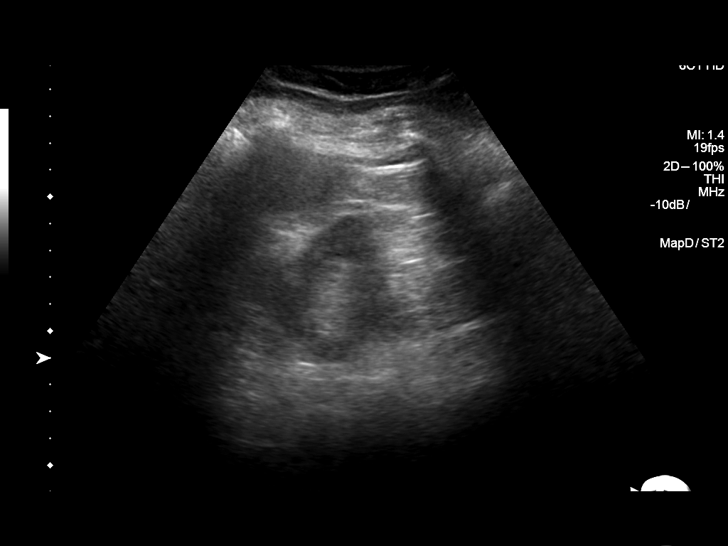
[im 43/52]
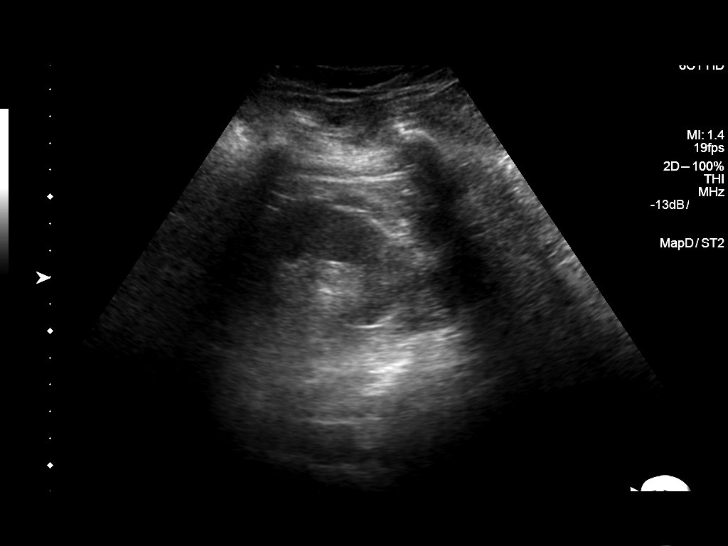
[im 47/52]
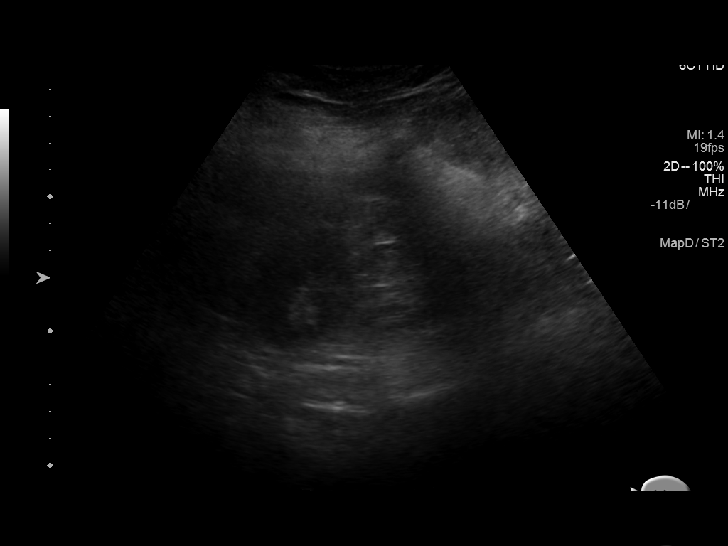
[im 52/52]
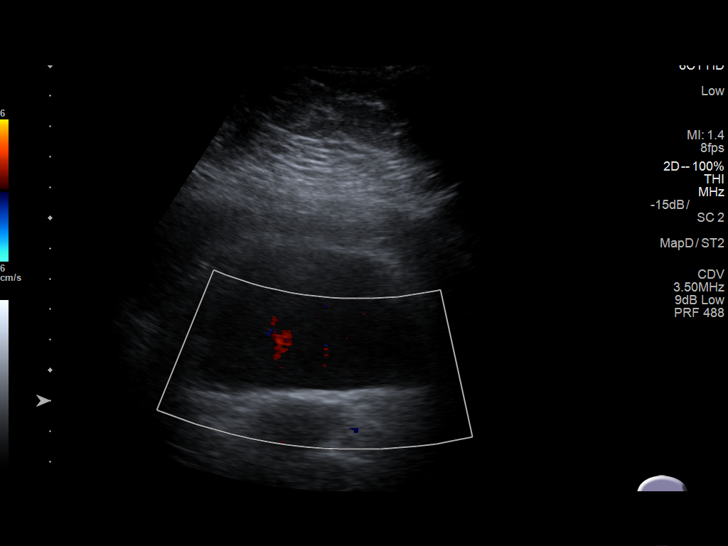

[14 of 25 positions shown; findings below may reference images not displayed]

FINDINGS: Right Kidney:

Length: 10.9 cm. Echogenicity within normal limits. No mass or
hydronephrosis visualized.

Left Kidney:

Length: 11.8 cm. Echogenicity within normal limits. No mass or
hydronephrosis visualized. No hydronephrosis noted on today's exam .

Bladder:

Appears normal for degree of bladder distention. Ureteral jets not
visualized.
IMPRESSION: No acute or focal abnormality identified. Interim resolution of
left-sided hydronephrosis.

## 2022-05-26 ENCOUNTER — Other Ambulatory Visit: Payer: Self-pay | Admitting: Neurological Surgery

## 2022-06-09 NOTE — Pre-Procedure Instructions (Signed)
Surgical Instructions    Your procedure is scheduled on June 20, 2022.  Report to [facility] Main Entrance "A" at 7:45 A.M., then check in with the Admitting office.  Call this number if you have problems the morning of surgery:  [phone]  If you have any questions prior to your surgery date call [phone]: Open Monday-Friday 8am-4pm If you experience any cold or flu symptoms such as cough, fever, chills, shortness of breath, etc. between now and your scheduled surgery, please notify us at the above number.     Remember:  Do not eat or drink after midnight the night before your surgery     Take these medicines the morning of surgery with A SIP OF WATER:  atorvastatin (LIPITOR)   traMADol (ULTRAM)    Follow your surgeon's instructions on when to stop Aspirin.  If no instructions were given by your surgeon then you will need to call the office to get those instructions.    As of today, STOP taking any Aleve, Naproxen, Ibuprofen, Motrin, Advil, Goody's, BC's, all herbal medications, fish oil, and all vitamins.   WHAT DO I DO ABOUT MY DIABETES MEDICATION?   Do not take metFORMIN (GLUCOPHAGE) or pioglitazone (ACTOS) the morning of surgery.   HOW TO MANAGE YOUR DIABETES BEFORE AND AFTER SURGERY  Why is it important to control my blood sugar before and after surgery? Improving blood sugar levels before and after surgery helps healing and can limit problems. A way of improving blood sugar control is eating a healthy diet by:  Eating less sugar and carbohydrates  Increasing activity/exercise  Talking with your doctor about reaching your blood sugar goals High blood sugars (greater than 180 mg/dL) can raise your risk of infections and slow your recovery, so you will need to focus on controlling your diabetes during the weeks before surgery. Make sure that the doctor who takes care of your diabetes knows about your planned surgery including the date and location.  How do I  manage my blood sugar before surgery? Check your blood sugar at least 4 times a day, starting 2 days before surgery, to make sure that the level is not too high or low.  Check your blood sugar the morning of your surgery when you wake up and every 2 hours until you get to the Short Stay unit.  If your blood sugar is less than 70 mg/dL, you will need to treat for low blood sugar: Do not take insulin. Treat a low blood sugar (less than 70 mg/dL) with  cup of clear juice (cranberry or apple), 4 glucose tablets, OR glucose gel. Recheck blood sugar in 15 minutes after treatment (to make sure it is greater than 70 mg/dL). If your blood sugar is not greater than 70 mg/dL on recheck, call [phone] for further instructions. Report your blood sugar to the short stay nurse when you get to Short Stay.  If you are admitted to the hospital after surgery: Your blood sugar will be checked by the staff and you will probably be given insulin after surgery (instead of oral diabetes medicines) to make sure you have good blood sugar levels. The goal for blood sugar control after surgery is 80-180 mg/dL.                      Do NOT Smoke (Tobacco/Vaping) for 24 hours prior to your procedure.  If you use a CPAP at night, you may bring your mask/headgear for your overnight stay.  Contacts, glasses, piercing's, hearing aid's, dentures or partials may not be worn into surgery, please bring cases for these belongings.    For patients admitted to the hospital, discharge time will be determined by your treatment team.   Patients discharged the day of surgery will not be allowed to drive home, and someone needs to stay with them for 24 hours.  SURGICAL WAITING ROOM VISITATION Patients having surgery or a procedure may have no more than 2 support people in the waiting area - these visitors may rotate.   Children under the age of 24 must have an adult with them who is not the patient. If the patient needs to  stay at the hospital during part of their recovery, the visitor guidelines for inpatient rooms apply. Pre-op nurse will coordinate an appropriate time for 1 support person to accompany patient in pre-op.  This support person may not rotate.   Please refer to the [facility] website for the visitor guidelines for Inpatients (after your surgery is over and you are in a regular room).    If you received a COVID test during your pre-op visit  it is requested that you wear a mask when out in public, stay away from anyone that may not be feeling well and notify your surgeon if you develop symptoms. If you have been in contact with anyone that has tested positive in the last 10 days please notify you surgeon.    Pre-operative 5 CHG Bath Instructions   You can play a key role in reducing the risk of infection after surgery. Your skin needs to be as free of germs as possible. You can reduce the number of germs on your skin by washing with CHG (chlorhexidine gluconate) soap before surgery. CHG is an antiseptic soap that kills germs and continues to kill germs even after washing.   DO NOT use if you have an allergy to chlorhexidine/CHG or antibacterial soaps. If your skin becomes reddened or irritated, stop using the CHG and notify one of our RNs at [phone].   Please shower with the CHG soap starting 4 days before surgery using the following schedule:     Please keep in mind the following:  DO NOT shave, including legs and underarms, starting the day of your first shower.   You may shave your face at any point before/day of surgery.  Place clean sheets on your bed the day you start using CHG soap. Use a clean washcloth (not used since being washed) for each shower. DO NOT sleep with pets once you start using the CHG.   CHG Shower Instructions:  If you choose to wash your hair and private area, wash first with your normal shampoo/soap.  After you use shampoo/soap, rinse your hair and body  thoroughly to remove shampoo/soap residue.  Turn the water OFF and apply about 3 tablespoons (45 ml) of CHG soap to a CLEAN washcloth.  Apply CHG soap ONLY FROM YOUR NECK DOWN TO YOUR TOES (washing for 3-5 minutes)  DO NOT use CHG soap on face, private areas, open wounds, or sores.  Pay special attention to the area where your surgery is being performed.  If you are having back surgery, having someone wash your back for you may be helpful. Wait 2 minutes after CHG soap is applied, then you may rinse off the CHG soap.  Pat dry with a clean towel  Put on clean clothes/pajamas   If you choose to wear lotion, please use ONLY the CHG-compatible  lotions on the back of this paper.     Additional instructions for the day of surgery: DO NOT APPLY any lotions, deodorants, cologne, or perfumes.  Do not wear jewelry or makeup  Do not bring valuables to the hospital. [facility] is not responsible for any belongings or valuables. Do not wear nail polish, gel polish, artificial nails, or any other type of covering on natural nails (fingers and toes) Put on clean/comfortable clothes.  Brush your teeth.  Ask your nurse before applying any prescription medications to the skin.      CHG Compatible Lotions   Aveeno Moisturizing lotion  Cetaphil Moisturizing Cream  Cetaphil Moisturizing Lotion  Clairol Herbal Essence Moisturizing Lotion, Dry Skin  Clairol Herbal Essence Moisturizing Lotion, Extra Dry Skin  Clairol Herbal Essence Moisturizing Lotion, Normal Skin  Curel Age Defying Therapeutic Moisturizing Lotion with Alpha Hydroxy  Curel Extreme Care Body Lotion  Curel Soothing Hands Moisturizing Hand Lotion  Curel Therapeutic Moisturizing Cream, Fragrance-Free  Curel Therapeutic Moisturizing Lotion, Fragrance-Free  Curel Therapeutic Moisturizing Lotion, Original Formula  Eucerin Daily Replenishing Lotion  Eucerin Dry Skin Therapy Plus Alpha Hydroxy Crme  Eucerin Dry Skin Therapy Plus Alpha  Hydroxy Lotion  Eucerin Original Crme  Eucerin Original Lotion  Eucerin Plus Crme Eucerin Plus Lotion  Eucerin TriLipid Replenishing Lotion  Keri Anti-Bacterial Hand Lotion  Keri Deep Conditioning Original Lotion Dry Skin Formula Softly Scented  Keri Deep Conditioning Original Lotion, Fragrance Free Sensitive Skin Formula  Keri Lotion Fast Absorbing Fragrance Free Sensitive Skin Formula  Keri Lotion Fast Absorbing Softly Scented Dry Skin Formula  Keri Original Lotion  Keri Skin Renewal Lotion Keri Silky Smooth Lotion  Keri Silky Smooth Sensitive Skin Lotion  Nivea Body Creamy Conditioning Oil  Nivea Body Extra Enriched Lotion  Nivea Body Original Lotion  Nivea Body Sheer Moisturizing Lotion Nivea Crme  Nivea Skin Firming Lotion  NutraDerm 30 Skin Lotion  NutraDerm Skin Lotion  NutraDerm Therapeutic Skin Cream  NutraDerm Therapeutic Skin Lotion  ProShield Protective Hand Cream  Provon moisturizing lotion    Please read over the following fact sheets that you were given.

## 2022-06-10 ENCOUNTER — Encounter (HOSPITAL_COMMUNITY): Payer: Self-pay

## 2022-06-10 ENCOUNTER — Encounter (HOSPITAL_COMMUNITY)
Admission: RE | Admit: 2022-06-10 | Discharge: 2022-06-10 | Disposition: A | Discharge status: Home / Self Care | Payer: Medicare Other | Source: Ambulatory Visit | Attending: Neurological Surgery | Admitting: Neurological Surgery

## 2022-06-10 ENCOUNTER — Other Ambulatory Visit: Payer: Self-pay

## 2022-06-10 VITALS — BP 153/74 | HR 83 | Temp 98.0°F | Resp 18 | Ht 62.0 in | Wt 176.8 lb

## 2022-06-10 DIAGNOSIS — Z01818 Encounter for other preprocedural examination: Secondary | ICD-10-CM | POA: Diagnosis not present

## 2022-06-10 DIAGNOSIS — E119 Type 2 diabetes mellitus without complications: Secondary | ICD-10-CM | POA: Diagnosis not present

## 2022-06-10 HISTORY — DX: Sleep apnea, unspecified: G47.30

## 2022-06-10 LAB — BASIC METABOLIC PANEL
Anion gap: 10 (ref 5–15)
BUN: 22 mg/dL (ref 8–23)
CO2: 24 mmol/L (ref 22–32)
Calcium: 9.5 mg/dL (ref 8.9–10.3)
Chloride: 101 mmol/L (ref 98–111)
Creatinine, Ser: 1.05 mg/dL — ABNORMAL HIGH (ref 0.44–1.00)
GFR, Estimated: 57 mL/min — ABNORMAL LOW (ref >60)
Glucose, Bld: 153 mg/dL — ABNORMAL HIGH (ref 70–99)
Potassium: 3.5 mmol/L (ref 3.5–5.1)
Sodium: 135 mmol/L (ref 135–145)

## 2022-06-10 LAB — TYPE AND SCREEN
ABO/RH(D): A POS
Antibody Screen: NEGATIVE

## 2022-06-10 LAB — CBC
HCT: 30.5 % — ABNORMAL LOW (ref 36.0–46.0)
Hemoglobin: 9.8 g/dL — ABNORMAL LOW (ref 12.0–15.0)
MCH: 29.7 pg (ref 26.0–34.0)
MCHC: 32.1 g/dL (ref 30.0–36.0)
MCV: 92.4 fL (ref 80.0–100.0)
Platelets: 229 10*3/uL (ref 150–400)
RBC: 3.3 MIL/uL — ABNORMAL LOW (ref 3.87–5.11)
RDW: 13.6 % (ref 11.5–15.5)
WBC: 5.8 10*3/uL (ref 4.0–10.5)
nRBC: 0 % (ref 0.0–0.2)

## 2022-06-10 LAB — PROTIME-INR
INR: 1 (ref 0.8–1.2)
Prothrombin Time: 13.7 seconds (ref 11.4–15.2)

## 2022-06-10 LAB — GLUCOSE, CAPILLARY: Glucose-Capillary: 147 mg/dL — ABNORMAL HIGH (ref 70–99)

## 2022-06-10 LAB — SURGICAL PCR SCREEN
MRSA, PCR: NEGATIVE
Staphylococcus aureus: NEGATIVE

## 2022-06-10 NOTE — Progress Notes (Signed)
PCP - lawrence greenblatt Cardiologist - has not seen one since being cleared after reading her echo over 10 years ago   PPM/ICD - denies  Chest x-ray - n/a EKG - 06/10/22 Stress Test - denies ECHO - over 10 years ago but cannot remember where- it was normal per patient  Cardiac Cath - denies  Sleep Study -  CPAP -   Fasting Blood Sugar - does not check CBG at home  ERAS Protcol -no   COVID TEST- n/a   Anesthesia review: no  Patient denies shortness of breath, fever, cough and chest pain at PAT appointment   All instructions explained to the patient, with a verbal understanding of the material. Patient agrees to go over the instructions while at home for a better understanding. Patient also instructed to self quarantine after being tested for COVID-19. The opportunity to ask questions was provided.

## 2022-06-10 NOTE — Pre-Procedure Instructions (Signed)
Surgical Instructions    Your procedure is scheduled on June 20, 2022.  Report to [facility] Main Entrance "A" at 10:00 A.M., then check in with the Admitting office.  Call this number if you have problems the morning of surgery:  [phone]  If you have any questions prior to your surgery date call [phone]: Open Monday-Friday 8am-4pm If you experience any cold or flu symptoms such as cough, fever, chills, shortness of breath, etc. between now and your scheduled surgery, please notify us at the above number.     Remember:  Do not eat or drink after midnight the night before your surgery     Take these medicines the morning of surgery with A SIP OF WATER:  atorvastatin (LIPITOR)   traMADol (ULTRAM)    Follow your surgeon's instructions on when to stop Aspirin.  If no instructions were given by your surgeon then you will need to call the office to get those instructions.    As of today, STOP taking any Aleve, Naproxen, Ibuprofen, Motrin, Advil, Goody's, BC's, all herbal medications, fish oil, and all vitamins.   WHAT DO I DO ABOUT MY DIABETES MEDICATION?   Do not take metFORMIN (GLUCOPHAGE) or pioglitazone (ACTOS) the morning of surgery.   HOW TO MANAGE YOUR DIABETES BEFORE AND AFTER SURGERY  Why is it important to control my blood sugar before and after surgery? Improving blood sugar levels before and after surgery helps healing and can limit problems. A way of improving blood sugar control is eating a healthy diet by:  Eating less sugar and carbohydrates  Increasing activity/exercise  Talking with your doctor about reaching your blood sugar goals High blood sugars (greater than 180 mg/dL) can raise your risk of infections and slow your recovery, so you will need to focus on controlling your diabetes during the weeks before surgery. Make sure that the doctor who takes care of your diabetes knows about your planned surgery including the date and location.  How do I  manage my blood sugar before surgery? Check your blood sugar at least 4 times a day, starting 2 days before surgery, to make sure that the level is not too high or low.  Check your blood sugar the morning of your surgery when you wake up and every 2 hours until you get to the Short Stay unit.  If your blood sugar is less than 70 mg/dL, you will need to treat for low blood sugar: Do not take insulin. Treat a low blood sugar (less than 70 mg/dL) with  cup of clear juice (cranberry or apple), 4 glucose tablets, OR glucose gel. Recheck blood sugar in 15 minutes after treatment (to make sure it is greater than 70 mg/dL). If your blood sugar is not greater than 70 mg/dL on recheck, call [phone] for further instructions. Report your blood sugar to the short stay nurse when you get to Short Stay.  If you are admitted to the hospital after surgery: Your blood sugar will be checked by the staff and you will probably be given insulin after surgery (instead of oral diabetes medicines) to make sure you have good blood sugar levels. The goal for blood sugar control after surgery is 80-180 mg/dL.                      Do NOT Smoke (Tobacco/Vaping) for 24 hours prior to your procedure.  If you use a CPAP at night, you may bring your mask/headgear for your overnight stay.  Contacts, glasses, piercing's, hearing aid's, dentures or partials may not be worn into surgery, please bring cases for these belongings.    For patients admitted to the hospital, discharge time will be determined by your treatment team.   Patients discharged the day of surgery will not be allowed to drive home, and someone needs to stay with them for 24 hours.  SURGICAL WAITING ROOM VISITATION Patients having surgery or a procedure may have no more than 2 support people in the waiting area - these visitors may rotate.   Children under the age of 47 must have an adult with them who is not the patient. If the patient needs to  stay at the hospital during part of their recovery, the visitor guidelines for inpatient rooms apply. Pre-op nurse will coordinate an appropriate time for 1 support person to accompany patient in pre-op.  This support person may not rotate.   Please refer to the [facility] website for the visitor guidelines for Inpatients (after your surgery is over and you are in a regular room).    If you received a COVID test during your pre-op visit  it is requested that you wear a mask when out in public, stay away from anyone that may not be feeling well and notify your surgeon if you develop symptoms. If you have been in contact with anyone that has tested positive in the last 10 days please notify you surgeon.    Pre-operative 5 CHG Bath Instructions   You can play a key role in reducing the risk of infection after surgery. Your skin needs to be as free of germs as possible. You can reduce the number of germs on your skin by washing with CHG (chlorhexidine gluconate) soap before surgery. CHG is an antiseptic soap that kills germs and continues to kill germs even after washing.   DO NOT use if you have an allergy to chlorhexidine/CHG or antibacterial soaps. If your skin becomes reddened or irritated, stop using the CHG and notify one of our RNs at [phone].   Please shower with the CHG soap starting 4 days before surgery using the following schedule:     Please keep in mind the following:  DO NOT shave, including legs and underarms, starting the day of your first shower.   You may shave your face at any point before/day of surgery.  Place clean sheets on your bed the day you start using CHG soap. Use a clean washcloth (not used since being washed) for each shower. DO NOT sleep with pets once you start using the CHG.   CHG Shower Instructions:  If you choose to wash your hair and private area, wash first with your normal shampoo/soap.  After you use shampoo/soap, rinse your hair and body  thoroughly to remove shampoo/soap residue.  Turn the water OFF and apply about 3 tablespoons (45 ml) of CHG soap to a CLEAN washcloth.  Apply CHG soap ONLY FROM YOUR NECK DOWN TO YOUR TOES (washing for 3-5 minutes)  DO NOT use CHG soap on face, private areas, open wounds, or sores.  Pay special attention to the area where your surgery is being performed.  If you are having back surgery, having someone wash your back for you may be helpful. Wait 2 minutes after CHG soap is applied, then you may rinse off the CHG soap.  Pat dry with a clean towel  Put on clean clothes/pajamas   If you choose to wear lotion, please use ONLY the CHG-compatible  lotions on the back of this paper.     Additional instructions for the day of surgery: DO NOT APPLY any lotions, deodorants, cologne, or perfumes.  Do not wear jewelry or makeup  Do not bring valuables to the hospital. [facility] is not responsible for any belongings or valuables. Do not wear nail polish, gel polish, artificial nails, or any other type of covering on natural nails (fingers and toes) Put on clean/comfortable clothes.  Brush your teeth.  Ask your nurse before applying any prescription medications to the skin.      CHG Compatible Lotions   Aveeno Moisturizing lotion  Cetaphil Moisturizing Cream  Cetaphil Moisturizing Lotion  Clairol Herbal Essence Moisturizing Lotion, Dry Skin  Clairol Herbal Essence Moisturizing Lotion, Extra Dry Skin  Clairol Herbal Essence Moisturizing Lotion, Normal Skin  Curel Age Defying Therapeutic Moisturizing Lotion with Alpha Hydroxy  Curel Extreme Care Body Lotion  Curel Soothing Hands Moisturizing Hand Lotion  Curel Therapeutic Moisturizing Cream, Fragrance-Free  Curel Therapeutic Moisturizing Lotion, Fragrance-Free  Curel Therapeutic Moisturizing Lotion, Original Formula  Eucerin Daily Replenishing Lotion  Eucerin Dry Skin Therapy Plus Alpha Hydroxy Crme  Eucerin Dry Skin Therapy Plus Alpha  Hydroxy Lotion  Eucerin Original Crme  Eucerin Original Lotion  Eucerin Plus Crme Eucerin Plus Lotion  Eucerin TriLipid Replenishing Lotion  Keri Anti-Bacterial Hand Lotion  Keri Deep Conditioning Original Lotion Dry Skin Formula Softly Scented  Keri Deep Conditioning Original Lotion, Fragrance Free Sensitive Skin Formula  Keri Lotion Fast Absorbing Fragrance Free Sensitive Skin Formula  Keri Lotion Fast Absorbing Softly Scented Dry Skin Formula  Keri Original Lotion  Keri Skin Renewal Lotion Keri Silky Smooth Lotion  Keri Silky Smooth Sensitive Skin Lotion  Nivea Body Creamy Conditioning Oil  Nivea Body Extra Enriched Lotion  Nivea Body Original Lotion  Nivea Body Sheer Moisturizing Lotion Nivea Crme  Nivea Skin Firming Lotion  NutraDerm 30 Skin Lotion  NutraDerm Skin Lotion  NutraDerm Therapeutic Skin Cream  NutraDerm Therapeutic Skin Lotion  ProShield Protective Hand Cream  Provon moisturizing lotion    Please read over the following fact sheets that you were given.

## 2022-06-11 LAB — HEMOGLOBIN A1C
Hgb A1c MFr Bld: 7.3 % — ABNORMAL HIGH (ref 4.8–5.6)
Mean Plasma Glucose: 163 mg/dL

## 2022-06-18 HISTORY — PX: BACK SURGERY: SHX140

## 2022-06-20 ENCOUNTER — Ambulatory Visit (HOSPITAL_BASED_OUTPATIENT_CLINIC_OR_DEPARTMENT_OTHER): Payer: Medicare Other

## 2022-06-20 ENCOUNTER — Ambulatory Visit (HOSPITAL_COMMUNITY): Payer: Medicare Other

## 2022-06-20 ENCOUNTER — Other Ambulatory Visit: Payer: Self-pay

## 2022-06-20 ENCOUNTER — Encounter (HOSPITAL_COMMUNITY): Payer: Self-pay | Admitting: Neurological Surgery

## 2022-06-20 ENCOUNTER — Encounter (HOSPITAL_COMMUNITY)
Admission: RE | Disposition: A | Discharge status: Home / Self Care | Payer: Self-pay | Source: Home / Self Care | Attending: Neurological Surgery

## 2022-06-20 ENCOUNTER — Observation Stay (HOSPITAL_COMMUNITY)
Admission: RE | Admit: 2022-06-20 | Discharge: 2022-06-21 | Disposition: A | Discharge status: Home / Self Care | Payer: Medicare Other | Attending: Neurological Surgery | Admitting: Neurological Surgery

## 2022-06-20 DIAGNOSIS — Z79899 Other long term (current) drug therapy: Secondary | ICD-10-CM | POA: Diagnosis not present

## 2022-06-20 DIAGNOSIS — M4316 Spondylolisthesis, lumbar region: Secondary | ICD-10-CM | POA: Diagnosis present

## 2022-06-20 DIAGNOSIS — M48061 Spinal stenosis, lumbar region without neurogenic claudication: Secondary | ICD-10-CM | POA: Insufficient documentation

## 2022-06-20 DIAGNOSIS — I10 Essential (primary) hypertension: Secondary | ICD-10-CM

## 2022-06-20 DIAGNOSIS — G4730 Sleep apnea, unspecified: Secondary | ICD-10-CM

## 2022-06-20 DIAGNOSIS — Z7982 Long term (current) use of aspirin: Secondary | ICD-10-CM | POA: Diagnosis not present

## 2022-06-20 DIAGNOSIS — E119 Type 2 diabetes mellitus without complications: Secondary | ICD-10-CM | POA: Diagnosis not present

## 2022-06-20 DIAGNOSIS — M5416 Radiculopathy, lumbar region: Secondary | ICD-10-CM | POA: Diagnosis not present

## 2022-06-20 DIAGNOSIS — Z91040 Latex allergy status: Secondary | ICD-10-CM | POA: Insufficient documentation

## 2022-06-20 DIAGNOSIS — Z981 Arthrodesis status: Secondary | ICD-10-CM

## 2022-06-20 DIAGNOSIS — Z7984 Long term (current) use of oral hypoglycemic drugs: Secondary | ICD-10-CM | POA: Insufficient documentation

## 2022-06-20 LAB — ABO/RH: ABO/RH(D): A POS

## 2022-06-20 LAB — GLUCOSE, CAPILLARY
Glucose-Capillary: 72 mg/dL (ref 70–99)
Glucose-Capillary: 72 mg/dL (ref 70–99)
Glucose-Capillary: 78 mg/dL (ref 70–99)
Glucose-Capillary: 86 mg/dL (ref 70–99)
Glucose-Capillary: 207 mg/dL — ABNORMAL HIGH (ref 70–99)

## 2022-06-20 SURGERY — POSTERIOR LUMBAR FUSION 1 LEVEL
Anesthesia: General | Site: Back

## 2022-06-20 MED ORDER — LISINOPRIL 20 MG PO TABS: 40.0000 mg | ORAL_TABLET | Freq: Every day | ORAL | Status: DC

## 2022-06-20 MED ORDER — HYDROMORPHONE HCL 1 MG/ML IJ SOLN
INTRAMUSCULAR | Status: AC
  Administered 2022-06-20: 0.25 mg via INTRAVENOUS
  Filled 2022-06-20: qty 1

## 2022-06-20 MED ORDER — SODIUM CHLORIDE 0.9% FLUSH: 3.0000 mL | INTRAVENOUS | Status: DC | PRN

## 2022-06-20 MED ORDER — DEXAMETHASONE SODIUM PHOSPHATE 10 MG/ML IJ SOLN
INTRAMUSCULAR | Status: AC
  Filled 2022-06-20: qty 1

## 2022-06-20 MED ORDER — ACETAMINOPHEN 500 MG PO TABS: 1000.0000 mg | ORAL_TABLET | Freq: Once | ORAL | Status: AC

## 2022-06-20 MED ORDER — INSULIN ASPART 100 UNIT/ML IJ SOLN: 0.0000 [IU] | Freq: Three times a day (TID) | INTRAMUSCULAR | Status: DC

## 2022-06-20 MED ORDER — HYDROCODONE-ACETAMINOPHEN 7.5-325 MG PO TABS
1.0000 | ORAL_TABLET | ORAL | Status: DC | PRN
  Administered 2022-06-20 – 2022-06-21 (×4): 1 via ORAL
  Filled 2022-06-20 (×4): qty 1

## 2022-06-20 MED ORDER — CEFAZOLIN SODIUM-DEXTROSE 2-4 GM/100ML-% IV SOLN
2.0000 g | Freq: Three times a day (TID) | INTRAVENOUS | Status: AC
  Administered 2022-06-20 – 2022-06-21 (×2): 2 g via INTRAVENOUS
  Filled 2022-06-20 (×3): qty 100

## 2022-06-20 MED ORDER — PROPOFOL 10 MG/ML IV BOLUS
INTRAVENOUS | Status: DC | PRN
  Administered 2022-06-20: 30 mg via INTRAVENOUS
  Administered 2022-06-20: 110 mg via INTRAVENOUS

## 2022-06-20 MED ORDER — ONDANSETRON HCL 4 MG/2ML IJ SOLN
INTRAMUSCULAR | Status: DC | PRN
  Administered 2022-06-20: 4 mg via INTRAVENOUS

## 2022-06-20 MED ORDER — 0.9 % SODIUM CHLORIDE (POUR BTL) OPTIME
TOPICAL | Status: DC | PRN
  Administered 2022-06-20: 1000 mL

## 2022-06-20 MED ORDER — PIOGLITAZONE HCL 15 MG PO TABS
45.0000 mg | ORAL_TABLET | Freq: Every day | ORAL | Status: DC
  Administered 2022-06-20 – 2022-06-21 (×2): 45 mg via ORAL
  Filled 2022-06-20 (×2): qty 3

## 2022-06-20 MED ORDER — LIDOCAINE 2% (20 MG/ML) 5 ML SYRINGE
INTRAMUSCULAR | Status: DC | PRN
  Administered 2022-06-20: 80 mg via INTRAVENOUS

## 2022-06-20 MED ORDER — LACTATED RINGERS IV SOLN: INTRAVENOUS | Status: DC

## 2022-06-20 MED ORDER — ONDANSETRON HCL 4 MG PO TABS: 4.0000 mg | ORAL_TABLET | Freq: Four times a day (QID) | ORAL | Status: DC | PRN

## 2022-06-20 MED ORDER — SUGAMMADEX SODIUM 200 MG/2ML IV SOLN
INTRAVENOUS | Status: DC | PRN
  Administered 2022-06-20: 200 mg via INTRAVENOUS

## 2022-06-20 MED ORDER — DEXAMETHASONE SODIUM PHOSPHATE 10 MG/ML IJ SOLN
INTRAMUSCULAR | Status: DC | PRN
  Administered 2022-06-20: 4 mg via INTRAVENOUS

## 2022-06-20 MED ORDER — METFORMIN HCL 500 MG PO TABS
1000.0000 mg | ORAL_TABLET | Freq: Two times a day (BID) | ORAL | Status: DC
  Administered 2022-06-21: 1000 mg via ORAL
  Filled 2022-06-20: qty 2

## 2022-06-20 MED ORDER — CELECOXIB 200 MG PO CAPS
200.0000 mg | ORAL_CAPSULE | Freq: Two times a day (BID) | ORAL | Status: DC
  Administered 2022-06-20 – 2022-06-21 (×2): 200 mg via ORAL
  Filled 2022-06-20 (×2): qty 1

## 2022-06-20 MED ORDER — INSULIN ASPART 100 UNIT/ML IJ SOLN: 0.0000 [IU] | INTRAMUSCULAR | Status: DC | PRN

## 2022-06-20 MED ORDER — HYDROXYZINE HCL 50 MG/ML IM SOLN
50.0000 mg | Freq: Four times a day (QID) | INTRAMUSCULAR | Status: DC | PRN
  Administered 2022-06-20: 50 mg via INTRAMUSCULAR
  Filled 2022-06-20: qty 1

## 2022-06-20 MED ORDER — ONDANSETRON HCL 4 MG/2ML IJ SOLN: 4.0000 mg | Freq: Four times a day (QID) | INTRAMUSCULAR | Status: DC | PRN

## 2022-06-20 MED ORDER — ASPIRIN 81 MG PO TBEC
81.0000 mg | DELAYED_RELEASE_TABLET | Freq: Every day | ORAL | Status: DC
  Administered 2022-06-20 – 2022-06-21 (×2): 81 mg via ORAL
  Filled 2022-06-20 (×2): qty 1

## 2022-06-20 MED ORDER — THROMBIN 20000 UNITS EX SOLR
CUTANEOUS | Status: AC
  Filled 2022-06-20: qty 20000

## 2022-06-20 MED ORDER — MENTHOL 3 MG MT LOZG: 1.0000 | LOZENGE | OROMUCOSAL | Status: DC | PRN

## 2022-06-20 MED ORDER — FENTANYL CITRATE (PF) 250 MCG/5ML IJ SOLN
INTRAMUSCULAR | Status: AC
  Filled 2022-06-20: qty 5

## 2022-06-20 MED ORDER — BUPIVACAINE HCL (PF) 0.25 % IJ SOLN
INTRAMUSCULAR | Status: AC
  Filled 2022-06-20: qty 30

## 2022-06-20 MED ORDER — ROCURONIUM BROMIDE 10 MG/ML (PF) SYRINGE
PREFILLED_SYRINGE | INTRAVENOUS | Status: DC | PRN
  Administered 2022-06-20: 40 mg via INTRAVENOUS
  Administered 2022-06-20: 60 mg via INTRAVENOUS

## 2022-06-20 MED ORDER — THROMBIN 20000 UNITS EX SOLR: CUTANEOUS | Status: DC | PRN

## 2022-06-20 MED ORDER — ONDANSETRON HCL 4 MG/2ML IJ SOLN
INTRAMUSCULAR | Status: AC
  Filled 2022-06-20: qty 2

## 2022-06-20 MED ORDER — HYDROMORPHONE HCL 1 MG/ML IJ SOLN
0.2500 mg | INTRAMUSCULAR | Status: DC | PRN
  Administered 2022-06-20 (×2): 0.25 mg via INTRAVENOUS
  Administered 2022-06-20: 0.5 mg via INTRAVENOUS

## 2022-06-20 MED ORDER — ACETAMINOPHEN 650 MG RE SUPP: 650.0000 mg | RECTAL | Status: DC | PRN

## 2022-06-20 MED ORDER — METFORMIN HCL 500 MG PO TABS
1000.0000 mg | ORAL_TABLET | Freq: Two times a day (BID) | ORAL | Status: DC
  Administered 2022-06-20: 1000 mg via ORAL
  Filled 2022-06-20: qty 2

## 2022-06-20 MED ORDER — CHLORHEXIDINE GLUCONATE CLOTH 2 % EX PADS: 6.0000 | MEDICATED_PAD | Freq: Once | CUTANEOUS | Status: DC

## 2022-06-20 MED ORDER — SODIUM CHLORIDE 0.9 % IV SOLN
250.0000 mL | INTRAVENOUS | Status: DC
  Administered 2022-06-20 (×2): 250 mL via INTRAVENOUS

## 2022-06-20 MED ORDER — SODIUM CHLORIDE 0.9% FLUSH: 3.0000 mL | Freq: Two times a day (BID) | INTRAVENOUS | Status: DC

## 2022-06-20 MED ORDER — PHENOL 1.4 % MT LIQD: 1.0000 | OROMUCOSAL | Status: DC | PRN

## 2022-06-20 MED ORDER — SENNA 8.6 MG PO TABS
1.0000 | ORAL_TABLET | Freq: Two times a day (BID) | ORAL | Status: DC
  Administered 2022-06-20 – 2022-06-21 (×2): 8.6 mg via ORAL
  Filled 2022-06-20 (×2): qty 1

## 2022-06-20 MED ORDER — THROMBIN 5000 UNITS EX SOLR
CUTANEOUS | Status: AC
  Filled 2022-06-20: qty 5000

## 2022-06-20 MED ORDER — SURGIRINSE WOUND IRRIGATION SYSTEM - OPTIME: TOPICAL | Status: DC | PRN

## 2022-06-20 MED ORDER — GABAPENTIN 300 MG PO CAPS
300.0000 mg | ORAL_CAPSULE | ORAL | Status: AC
  Administered 2022-06-20: 300 mg via ORAL
  Filled 2022-06-20: qty 1

## 2022-06-20 MED ORDER — ORAL CARE MOUTH RINSE: 15.0000 mL | Freq: Once | OROMUCOSAL | Status: AC

## 2022-06-20 MED ORDER — HYDROMORPHONE HCL 1 MG/ML IJ SOLN: 0.5000 mg | INTRAMUSCULAR | Status: DC | PRN

## 2022-06-20 MED ORDER — PHENYLEPHRINE 80 MCG/ML (10ML) SYRINGE FOR IV PUSH (FOR BLOOD PRESSURE SUPPORT)
PREFILLED_SYRINGE | INTRAVENOUS | Status: DC | PRN
  Administered 2022-06-20: 80 ug via INTRAVENOUS

## 2022-06-20 MED ORDER — ACETAMINOPHEN 325 MG PO TABS: 650.0000 mg | ORAL_TABLET | ORAL | Status: DC | PRN

## 2022-06-20 MED ORDER — POTASSIUM CHLORIDE IN NACL 20-0.9 MEQ/L-% IV SOLN: INTRAVENOUS | Status: DC

## 2022-06-20 MED ORDER — THROMBIN 5000 UNITS EX SOLR: OROMUCOSAL | Status: DC | PRN

## 2022-06-20 MED ORDER — CEFAZOLIN SODIUM-DEXTROSE 2-4 GM/100ML-% IV SOLN
2.0000 g | INTRAVENOUS | Status: AC
  Administered 2022-06-20: 2 g via INTRAVENOUS
  Filled 2022-06-20: qty 100

## 2022-06-20 MED ORDER — PROPOFOL 10 MG/ML IV BOLUS
INTRAVENOUS | Status: AC
  Filled 2022-06-20: qty 20

## 2022-06-20 MED ORDER — METHOCARBAMOL 1000 MG/10ML IJ SOLN: 500.0000 mg | Freq: Four times a day (QID) | INTRAVENOUS | Status: DC | PRN

## 2022-06-20 MED ORDER — METHOCARBAMOL 500 MG PO TABS
500.0000 mg | ORAL_TABLET | Freq: Four times a day (QID) | ORAL | Status: DC | PRN
  Administered 2022-06-20 – 2022-06-21 (×2): 500 mg via ORAL
  Filled 2022-06-20 (×2): qty 1

## 2022-06-20 MED ORDER — ROCURONIUM BROMIDE 10 MG/ML (PF) SYRINGE
PREFILLED_SYRINGE | INTRAVENOUS | Status: AC
  Filled 2022-06-20: qty 10

## 2022-06-20 MED ORDER — BUPIVACAINE HCL (PF) 0.25 % IJ SOLN
INTRAMUSCULAR | Status: DC | PRN
  Administered 2022-06-20: 8 mL

## 2022-06-20 MED ORDER — CHLORTHALIDONE 25 MG PO TABS: 25.0000 mg | ORAL_TABLET | Freq: Every day | ORAL | Status: DC

## 2022-06-20 MED ORDER — ACETAMINOPHEN 500 MG PO TABS
1000.0000 mg | ORAL_TABLET | ORAL | Status: AC
  Administered 2022-06-20: 1000 mg via ORAL
  Filled 2022-06-20: qty 2

## 2022-06-20 MED ORDER — AMISULPRIDE (ANTIEMETIC) 5 MG/2ML IV SOLN: 10.0000 mg | Freq: Once | INTRAVENOUS | Status: DC | PRN

## 2022-06-20 MED ORDER — MIDAZOLAM HCL 2 MG/2ML IJ SOLN
INTRAMUSCULAR | Status: AC
  Filled 2022-06-20: qty 2

## 2022-06-20 MED ORDER — FOSINOPRIL SODIUM 20 MG PO TABS
40.0000 mg | ORAL_TABLET | Freq: Every day | ORAL | Status: DC
  Filled 2022-06-20: qty 2

## 2022-06-20 MED ORDER — PHENYLEPHRINE HCL-NACL 20-0.9 MG/250ML-% IV SOLN
INTRAVENOUS | Status: DC | PRN
  Administered 2022-06-20: 40 ug/min via INTRAVENOUS

## 2022-06-20 MED ORDER — CHLORHEXIDINE GLUCONATE 0.12 % MT SOLN
15.0000 mL | Freq: Once | OROMUCOSAL | Status: AC
  Administered 2022-06-20: 15 mL via OROMUCOSAL
  Filled 2022-06-20: qty 15

## 2022-06-20 MED ORDER — FENTANYL CITRATE (PF) 250 MCG/5ML IJ SOLN
INTRAMUSCULAR | Status: DC | PRN
  Administered 2022-06-20: 50 ug via INTRAVENOUS
  Administered 2022-06-20: 100 ug via INTRAVENOUS
  Administered 2022-06-20 (×2): 50 ug via INTRAVENOUS

## 2022-06-20 MED ORDER — LIDOCAINE 2% (20 MG/ML) 5 ML SYRINGE
INTRAMUSCULAR | Status: AC
  Filled 2022-06-20: qty 5

## 2022-06-20 SURGICAL SUPPLY — 65 items
ADH SKN CLS APL DERMABOND .7 (GAUZE/BANDAGES/DRESSINGS) ×1
APL SKNCLS STERI-STRIP NONHPOA (GAUZE/BANDAGES/DRESSINGS) ×1
BAG COUNTER SPONGE SURGICOUNT (BAG) ×1 IMPLANT
BAG SPNG CNTER NS LX DISP (BAG) ×1
BASKET BONE COLLECTION (BASKET) ×1 IMPLANT
BENZOIN TINCTURE PRP APPL 2/3 (GAUZE/BANDAGES/DRESSINGS) ×1 IMPLANT
BLADE BONE MILL MEDIUM (MISCELLANEOUS) ×1 IMPLANT
BLADE CLIPPER SURG (BLADE) IMPLANT
BUR CARBIDE MATCH 3.0 (BURR) ×1 IMPLANT
CANISTER SUCT 3000ML PPV (MISCELLANEOUS) ×1 IMPLANT
CNTNR URN SCR LID CUP LEK RST (MISCELLANEOUS) ×1 IMPLANT
CONT SPEC 4OZ STRL OR WHT (MISCELLANEOUS) ×1
COVER BACK TABLE 60X90IN (DRAPES) ×1 IMPLANT
DERMABOND ADVANCED .7 DNX12 (GAUZE/BANDAGES/DRESSINGS) ×1 IMPLANT
DRAPE C-ARM 42X72 X-RAY (DRAPES) ×2 IMPLANT
DRAPE C-ARMOR (DRAPES) ×1 IMPLANT
DRAPE LAPAROTOMY 100X72X124 (DRAPES) ×1 IMPLANT
DRAPE SURG 17X23 STRL (DRAPES) ×1 IMPLANT
DRSG OPSITE POSTOP 4X6 (GAUZE/BANDAGES/DRESSINGS) IMPLANT
DURAPREP 26ML APPLICATOR (WOUND CARE) ×1 IMPLANT
ELECT REM PT RETURN 9FT ADLT (ELECTROSURGICAL) ×1
ELECTRODE REM PT RTRN 9FT ADLT (ELECTROSURGICAL) ×1 IMPLANT
EVACUATOR 1/8 PVC DRAIN (DRAIN) ×1 IMPLANT
FIBER BONE ALLOSYNC EXPAND 5 (Bone Implant) IMPLANT
GAUZE 4X4 16PLY ~~LOC~~+RFID DBL (SPONGE) IMPLANT
GLOVE BIO SURGEON STRL SZ7 (GLOVE) IMPLANT
GLOVE BIO SURGEON STRL SZ8 (GLOVE) ×2 IMPLANT
GLOVE BIOGEL PI IND STRL 7.0 (GLOVE) IMPLANT
GOWN STRL REUS W/ TWL LRG LVL3 (GOWN DISPOSABLE) IMPLANT
GOWN STRL REUS W/ TWL XL LVL3 (GOWN DISPOSABLE) ×2 IMPLANT
GOWN STRL REUS W/TWL 2XL LVL3 (GOWN DISPOSABLE) IMPLANT
GOWN STRL REUS W/TWL LRG LVL3 (GOWN DISPOSABLE)
GOWN STRL REUS W/TWL XL LVL3 (GOWN DISPOSABLE) ×2
HEMOSTAT POWDER KIT SURGIFOAM (HEMOSTASIS) ×1 IMPLANT
KIT BASIN OR (CUSTOM PROCEDURE TRAY) ×1 IMPLANT
KIT GRAFTMAG DEL NEURO DISP (NEUROSURGERY SUPPLIES) IMPLANT
KIT POSITION SURG JACKSON T1 (MISCELLANEOUS) ×1 IMPLANT
KIT TURNOVER KIT B (KITS) ×1 IMPLANT
MILL BONE PREP (MISCELLANEOUS) ×1 IMPLANT
NDL HYPO 25X1 1.5 SAFETY (NEEDLE) ×1 IMPLANT
NEEDLE HYPO 25X1 1.5 SAFETY (NEEDLE) ×1 IMPLANT
NS IRRIG 1000ML POUR BTL (IV SOLUTION) ×1 IMPLANT
PACK LAMINECTOMY NEURO (CUSTOM PROCEDURE TRAY) ×1 IMPLANT
PAD ARMBOARD 7.5X6 YLW CONV (MISCELLANEOUS) ×3 IMPLANT
PUTTY DBM 5CC (Putty) IMPLANT
ROD LORD LIPPED TI 5.5X35 (Rod) IMPLANT
SCREW CORT SHANK MOD 6.5X40 (Screw) IMPLANT
SCREW KODIAK 6.5X40 (Screw) IMPLANT
SCREW POLYAXIAL TULIP (Screw) IMPLANT
SET SCREW (Screw) ×4 IMPLANT
SET SCREW SPNE (Screw) IMPLANT
SOL ELECTROSURG ANTI STICK (MISCELLANEOUS) ×1
SOLUTION ELECTROSURG ANTI STCK (MISCELLANEOUS) ×1 IMPLANT
SOLUTION IRRIG SURGIPHOR (IV SOLUTION) ×1 IMPLANT
SPONGE SURGIFOAM ABS GEL 100 (HEMOSTASIS) ×1 IMPLANT
SPONGE T-LAP 4X18 ~~LOC~~+RFID (SPONGE) IMPLANT
STRIP CLOSURE SKIN 1/2X4 (GAUZE/BANDAGES/DRESSINGS) ×2 IMPLANT
SUT VIC AB 0 CT1 18XCR BRD8 (SUTURE) ×1 IMPLANT
SUT VIC AB 0 CT1 8-18 (SUTURE) ×1
SUT VIC AB 2-0 CP2 18 (SUTURE) ×1 IMPLANT
SUT VIC AB 3-0 SH 8-18 (SUTURE) ×2 IMPLANT
TOWEL GREEN STERILE (TOWEL DISPOSABLE) ×1 IMPLANT
TOWEL GREEN STERILE FF (TOWEL DISPOSABLE) ×1 IMPLANT
TRAY FOLEY MTR SLVR 16FR STAT (SET/KITS/TRAYS/PACK) ×1 IMPLANT
WATER STERILE IRR 1000ML POUR (IV SOLUTION) ×1 IMPLANT

## 2022-06-20 NOTE — Transfer of Care (Signed)
Immediate Anesthesia Transfer of Care Note  Patient: Isabella Stewart  Procedure(s) Performed: Lumbar Four-Lumbar Five Posterior Lumbar Interbody Fusion (Back)  Patient Location: PACU  Anesthesia Type:General  Level of Consciousness: awake and alert   Airway & Oxygen Therapy: Patient Spontanous Breathing and Patient connected to face mask oxygen  Post-op Assessment: Report given to RN and Post -op Vital signs reviewed and stable  Post vital signs: Reviewed and stable  Last Vitals:  Vitals Value Taken Time  BP 122/76 06/20/22 1441  Temp    Pulse 85 06/20/22 1443  Resp 10 06/20/22 1443  SpO2 100 % 06/20/22 1443  Vitals shown include unvalidated device data.  Last Pain:  Vitals:   06/20/22 1003  PainSc: 5       Patients Stated Pain Goal: 2 (06/20/22 1003)  Complications: No notable events documented.

## 2022-06-20 NOTE — Op Note (Signed)
06/20/2022  2:33 PM  PATIENT:  Isabella Stewart  72 y.o. 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 Decompressive lumbar laminectomy, hemi facetectomy and foraminotomies L4-5  2. Posterior fixation L4-5 using ATEC cortical pedicle screws.  3. Intertransverse arthrodesis L4-5 bilaterally using morcellized autograft and allograft.  SURGEON:  Marikay Alar, MD  ASSISTANTS: Verlin Dike, FNP  ANESTHESIA:  General  EBL: 50 ml  Total I/O In: 400 [I.V.:400] Out: -   BLOOD ADMINISTERED:none  DRAINS: none   INDICATION FOR PROCEDURE: This patient presented with severe back pain with right more than left leg pain. Imaging revealed current severe lumbar spinal stenosis at L4-5 with a postlaminectomy spondylolisthesis at L4-5. The patient tried a reasonable attempt at conservative medical measures without relief. I recommended decompression and instrumented fusion to address the stenosis as well as the segmental  instability.  Patient understood the risks, benefits, and alternatives and potential outcomes and wished to proceed.  PROCEDURE DETAILS:  The patient was brought to the operating room. After induction of generalized endotracheal anesthesia the patient was rolled into the prone position on chest rolls and all pressure points were padded. The patient's lumbar region was cleaned and then prepped with DuraPrep and draped in the usual sterile fashion. Anesthesia was injected and then a dorsal midline incision was made and carried down to the lumbosacral fascia. The fascia was opened and the paraspinous musculature was taken down in a subperiosteal fashion to expose L4-5. A self-retaining retractor was placed. Intraoperative fluoroscopy confirmed my level, and I started with placement of the L4 cortical pedicle screws. The pedicle screw  entry zones were identified utilizing surface landmarks and  AP and lateral fluoroscopy. I scored the cortex with the high-speed drill and then used the hand drill to drill an upward and outward direction into the pedicle. I then tapped line to line. I then placed a 6.5 x 40 mm cortical pedicle screw into the pedicles of L4 bilaterally.    I then turned my attention to the decompression and complete lumbar laminectomies, hemi- facetectomies, and foraminotomies were performed at L4-5.  We removed the spinous process.  My nurse practitioner was directly involved in the decompression and exposure of the neural elements. the patient had significant spinal stenosis with epidural fibrosis from her previous surgery.  Much more generous decompression and generous foraminotomy was undertaken in order to adequately decompress the neural elements and address the patient's leg pain.  What was left of the the yellow ligament was removed to expose the underlying dura and nerve roots, and generous foraminotomies were performed to adequately decompress the neural elements. Both the exiting and traversing nerve roots were decompressed on both sides until a coronary dilator passed easily along the nerve roots.   We then turned our attention to the placement of the lower pedicle screws. The pedicle screw entry zones were identified utilizing surface landmarks and fluoroscopy. I drilled into each pedicle utilizing the hand drill, and tapped each pedicle with the appropriate tap. We palpated with a ball probe to assure no break in the cortex. We then placed 6.5 x 40 mm pedicle screws into the pedicles bilaterally at L5.  My nurse practitioner assisted in placement of the pedicle screws.  We then decorticated the transverse processes of L4 and L5 bilaterally and laid a mixture of morcellized autograft and allograft out over these to perform intertransverse arthrodesis at  L4-5 bilaterally. We then placed lordotic rods into the  multiaxial screw heads of the pedicle screws and locked these in position with the locking caps and anti-torque device. We then checked our construct with AP and lateral fluoroscopy. Irrigated with copious amounts of 0.5% povidone iodine solution followed by saline solution. Inspected the nerve roots once again to assure adequate decompression, lined to the dura with Gelfoam,  and then we closed the muscle and the fascia with 0 Vicryl. Closed the subcutaneous tissues with 2-0 Vicryl and subcuticular tissues with 3-0 Vicryl. The skin was closed with benzoin and Steri-Strips. Dressing was then applied, the patient was awakened from general anesthesia and transported to the recovery room in stable condition. At the end of the procedure all sponge, needle and instrument counts were correct.   PLAN OF CARE: admit to inpatient  PATIENT DISPOSITION:  PACU - hemodynamically stable.   Delay start of Pharmacological VTE agent (>24hrs) due to surgical blood loss or risk of bleeding:  yes

## 2022-06-20 NOTE — Anesthesia Procedure Notes (Signed)
Procedure Name: Intubation Date/Time: 06/20/2022 12:16 PM  Performed by: Caren Macadam, CRNAPre-anesthesia Checklist: Patient identified, Emergency Drugs available, Suction available and Patient being monitored Patient Re-evaluated:Patient Re-evaluated prior to induction Oxygen Delivery Method: Circle system utilized Preoxygenation: Pre-oxygenation with 100% oxygen Induction Type: IV induction Ventilation: Mask ventilation without difficulty and Oral airway inserted - appropriate to patient size Laryngoscope Size: Mac and 3 Grade View: Grade I Tube type: Oral Tube size: 7.0 mm Number of attempts: 1 Airway Equipment and Method: Stylet and Oral airway Placement Confirmation: ETT inserted through vocal cords under direct vision, positive ETCO2 and breath sounds checked- equal and bilateral Secured at: 23 cm Tube secured with: Tape Dental Injury: Teeth and Oropharynx as per pre-operative assessment

## 2022-06-20 NOTE — H&P (Signed)
Subjective: Patient is a 72 y.o. female admitted for leg pain. Onset of symptoms was several months ago, gradually worsening since that time.  The pain is rated severe, and is located at the across the lower back and radiates to legs R>L. The pain is described as aching and occurs all day. The symptoms have been progressive. Symptoms are exacerbated by exercise and standing. 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   Past Medical History:  Diagnosis Date   Diabetes mellitus without complication (HCC)    type 2   Heart murmur    History of kidney stones    Hypertension    Renal disorder    kidney stones   Sleep apnea     Past Surgical History:  Procedure Laterality Date   BACK SURGERY     CYSTOSCOPY W/ URETERAL STENT PLACEMENT Left 04/08/2016   Procedure: CYSTOSCOPY WITH RETROGRADE PYELOGRAM/URETERAL STENT PLACEMENT;  Surgeon: Malen Gauze, MD;  Location: [facility];  Service: Urology;  Laterality: Left;   CYSTOSCOPY WITH RETROGRADE PYELOGRAM, URETEROSCOPY AND STENT PLACEMENT Left 05/02/2016   Procedure: CYSTOSCOPY WITH RETROGRADE PYELOGRAM, URETEROSCOPY AND STENT REPLACEMENT;  Surgeon: Malen Gauze, MD;  Location: [facility];  Service: Urology;  Laterality: Left;  1 HR [phone] [id number]   MENISCUS REPAIR Left     Prior to Admission medications   Medication Sig Start Date End Date Taking? Authorizing Provider  aspirin EC 81 MG tablet Take 81 mg by mouth daily.   Yes [provider]  atorvastatin (LIPITOR) 40 MG tablet Take 40 mg by mouth in the morning.   Yes [provider]  chlorthalidone (HYGROTON) 25 MG tablet Take 25 mg by mouth daily.   Yes [provider]  fosinopril (MONOPRIL) 40 MG tablet Take 40 mg by mouth daily.   Yes [provider]  ibuprofen (ADVIL) 200 MG tablet Take 200 mg by mouth every 6 (six) hours as needed for moderate pain or mild pain.   Yes [provider]  metFORMIN (GLUCOPHAGE) 500 MG  tablet Take 1,000 mg by mouth 2 (two) times daily with a meal.   Yes [provider]  pioglitazone (ACTOS) 45 MG tablet Take 45 mg by mouth daily.   Yes [provider]  traMADol (ULTRAM) 50 MG tablet Take 50 mg by mouth 2 (two) times daily. 05/21/22  Yes [provider]   Allergies  Allergen Reactions   Latex Rash   Premarin [Conjugated Estrogens] Rash    Social History   Tobacco Use   Smoking status: Never   Smokeless tobacco: Never  Substance Use Topics   Alcohol use: No    History reviewed. No pertinent family history.   Review of Systems  Positive ROS: neg  All other systems have been reviewed and were otherwise negative with the exception of those mentioned in the HPI and as above.  Objective: Vital signs in last 24 hours: Temp:  [98.2 F (36.8 C)] 98.2 F (36.8 C) (06/03 0952) Pulse Rate:  [90] 90 (06/03 0952) Resp:  [17] 17 (06/03 0952) BP: (137)/(67) 137/67 (06/03 0952) SpO2:  [99 %] 99 % (06/03 0952) Weight:  [77.6 kg] 77.6 kg (06/03 0952)  General Appearance: Alert, cooperative, no distress, appears stated age Head: Normocephalic, without obvious abnormality, atraumatic Eyes: PERRL, conjunctiva/corneas clear, EOM's intact    Neck: Supple, symmetrical, trachea midline Back: Symmetric, no curvature, ROM normal, no CVA tenderness Lungs:  respirations unlabored Heart: Regular rate and rhythm Abdomen: Soft, non-tender Extremities: Extremities  normal, atraumatic, no cyanosis or edema Pulses: 2+ and symmetric all extremities Skin: Skin color, texture, turgor normal, no rashes or lesions  NEUROLOGIC:   Mental status: Alert and oriented x4,  no aphasia, good attention span, fund of knowledge, and memory Motor Exam - grossly normal Sensory Exam - grossly normal Reflexes: 1+ Coordination - grossly normal Gait - grossly normal Balance - grossly normal Cranial Nerves: I: smell Not tested  II: visual acuity  OS: nl    OD: nl  II:  visual fields Full to confrontation  II: pupils Equal, round, reactive to light  III,VII: ptosis None  III,IV,VI: extraocular muscles  Full ROM  V: mastication Normal  V: facial light touch sensation  Normal  V,VII: corneal reflex  Present  VII: facial muscle function - upper  Normal  VII: facial muscle function - lower Normal  VIII: hearing Not tested  IX: soft palate elevation  Normal  IX,X: gag reflex Present  XI: trapezius strength  5/5  XI: sternocleidomastoid strength 5/5  XI: neck flexion strength  5/5  XII: tongue strength  Normal    Data Review Lab Results  Component Value Date   WBC 5.8 06/10/2022   HGB 9.8 (L) 06/10/2022   HCT 30.5 (L) 06/10/2022   MCV 92.4 06/10/2022   PLT 229 06/10/2022   Lab Results  Component Value Date   NA 135 06/10/2022   K 3.5 06/10/2022   CL 101 06/10/2022   CO2 24 06/10/2022   BUN 22 06/10/2022   CREATININE 1.05 (H) 06/10/2022   GLUCOSE 153 (H) 06/10/2022   Lab Results  Component Value Date   INR 1.0 06/10/2022    Assessment/Plan:  Estimated body mass index is 31.28 kg/m as calculated from the following:   Height as of this encounter: 5\' 2"  (1.575 m).   Weight as of this encounter: 77.6 kg. Patient admitted for decompression and instrumented fusion L4-5. Patient has failed a reasonable attempt at conservative therapy.  I explained the condition and procedure to the patient and answered any questions.  Patient wishes to proceed with procedure as planned. Understands risks/ benefits and typical outcomes of procedure.   Tia Alert 06/20/2022 11:57 AM

## 2022-06-20 NOTE — Anesthesia Preprocedure Evaluation (Addendum)
Anesthesia Evaluation  Patient identified by MRN, date of birth, ID band Patient awake    Reviewed: Allergy & Precautions, NPO status , Patient's Chart, lab work & pertinent test results  Airway Mallampati: III  TM Distance: >3 FB Neck ROM: Full    Dental no notable dental hx. (+) Dental Advisory Given, Teeth Intact   Pulmonary sleep apnea    Pulmonary exam normal breath sounds clear to auscultation       Cardiovascular hypertension, Pt. on medications + Valvular Problems/Murmurs  Rhythm:Regular Rate:Normal + Systolic murmurs    Neuro/Psych negative neurological ROS     GI/Hepatic negative GI ROS, Neg liver ROS,,,  Endo/Other  diabetes    Renal/GU Renal disease     Musculoskeletal negative musculoskeletal ROS (+)    Abdominal  (+) + obese  Peds  Hematology negative hematology ROS (+)   Anesthesia Other Findings   Reproductive/Obstetrics                             Anesthesia Physical Anesthesia Plan  ASA: 3  Anesthesia Plan: General   Post-op Pain Management: Tylenol PO (pre-op)*   Induction: Intravenous  PONV Risk Score and Plan: 4 or greater and Ondansetron, Dexamethasone and Treatment may vary due to age or medical condition  Airway Management Planned: Oral ETT  Additional Equipment:   Intra-op Plan:   Post-operative Plan: Extubation in OR  Informed Consent: I have reviewed the patients History and Physical, chart, labs and discussed the procedure including the risks, benefits and alternatives for the proposed anesthesia with the patient or authorized representative who has indicated his/her understanding and acceptance.     Dental advisory given  Plan Discussed with: CRNA  Anesthesia Plan Comments: (Remote history of echocardiogram. No hx of cardiology f/u. Denies DOE.)        Anesthesia Quick Evaluation

## 2022-06-21 DIAGNOSIS — M4316 Spondylolisthesis, lumbar region: Secondary | ICD-10-CM | POA: Diagnosis not present

## 2022-06-21 LAB — GLUCOSE, CAPILLARY: Glucose-Capillary: 89 mg/dL (ref 70–99)

## 2022-06-21 MED ORDER — HYDROCODONE-ACETAMINOPHEN 5-325 MG PO TABS: 1.0000 | ORAL_TABLET | ORAL | 0 refills | Status: DC | PRN

## 2022-06-21 MED ORDER — METHOCARBAMOL 750 MG PO TABS: 750.0000 mg | ORAL_TABLET | Freq: Four times a day (QID) | ORAL | 0 refills | Status: DC

## 2022-06-21 NOTE — Plan of Care (Signed)
  Problem: Education: Goal: Ability to verbalize activity precautions or restrictions will improve Outcome: Completed/Met Goal: Knowledge of the prescribed therapeutic regimen will improve Outcome: Completed/Met Goal: Understanding of discharge needs will improve Outcome: Completed/Met   Problem: Activity: Goal: Ability to avoid complications of mobility impairment will improve Outcome: Completed/Met Goal: Ability to tolerate increased activity will improve Outcome: Completed/Met Goal: Will remain free from falls Outcome: Completed/Met   Problem: Bowel/Gastric: Goal: Gastrointestinal status for postoperative course will improve Outcome: Completed/Met   Problem: Clinical Measurements: Goal: Ability to maintain clinical measurements within normal limits will improve Outcome: Completed/Met Goal: Postoperative complications will be avoided or minimized Outcome: Completed/Met Goal: Diagnostic test results will improve Outcome: Completed/Met   Problem: Pain Management: Goal: Pain level will decrease Outcome: Completed/Met   Problem: Skin Integrity: Goal: Will show signs of wound healing Outcome: Completed/Met   Problem: Health Behavior/Discharge Planning: Goal: Identification of resources available to assist in meeting health care needs will improve Outcome: Completed/Met   Problem: Bladder/Genitourinary: Goal: Urinary functional status for postoperative course will improve Outcome: Completed/Met   Problem: Acute Rehab PT Goals(only PT should resolve) Goal: Pt Will Go Supine/Side To Sit Outcome: Completed/Met   

## 2022-06-21 NOTE — Progress Notes (Signed)
Patient alert and oriented, mae's well, voiding adequate amount of urine, swallowing without difficulty, no c/o pain at time of discharge. Patient discharged home with family. Script and discharged instructions given to patient. Patient and family stated understanding of instructions given. Patient has an appointment with Dr. Jones °

## 2022-06-21 NOTE — Anesthesia Postprocedure Evaluation (Signed)
Anesthesia Post Note  Patient: Isabella Stewart  Procedure(s) Performed: Lumbar Four-Lumbar Five Posterior Lumbar Interbody Fusion (Back)     Patient location during evaluation: PACU Anesthesia Type: General Level of consciousness: sedated and patient cooperative Pain management: pain level controlled Vital Signs Assessment: post-procedure vital signs reviewed and stable Respiratory status: spontaneous breathing Cardiovascular status: stable Anesthetic complications: no   No notable events documented.  Last Vitals:  Vitals:   06/21/22 0516 06/21/22 0734  BP: 119/60 103/68  Pulse: 84 79  Resp: 18 16  Temp: 36.6 C 36.6 C  SpO2: 99% 100%    Last Pain:  Vitals:   06/21/22 0734  TempSrc: Oral  PainSc:                  [name]

## 2022-06-21 NOTE — Evaluation (Signed)
Physical Therapy Evaluation  Patient Details Name: Isabella Stewart MRN: [id number] DOB: 1950/09/14 Today's Date: 06/21/2022  History of Present Illness  Pt is a 72 y.o. female s/p decompressive lumbar laminectomy, hemi facetectomy, and foraminotomies L4-5 with posterior fixation and intertransverse arthrodesis. PMH significant for DMII, heart murmur, HTN, sleep apnea, back surgery.   Clinical Impression  Pt admitted with above diagnosis. At the time of PT eval, pt was able to demonstrate transfers and ambulation with gross min guard assist to supervision for safety and RW for support. Pt was educated on precautions, brace application/wearing schedule, appropriate activity progression, and car transfer. Pt currently with functional limitations due to the deficits listed below (see PT Problem List). Pt will benefit from skilled PT to increase their independence and safety with mobility to allow discharge to the venue listed below.         Recommendations for follow up therapy are one component of a multi-disciplinary discharge planning process, led by the attending physician.  Recommendations may be updated based on patient status, additional functional criteria and insurance authorization.  Follow Up Recommendations       Assistance Recommended at Discharge PRN  Patient can return home with the following  A little help with walking and/or transfers;A little help with bathing/dressing/bathroom;Assistance with cooking/housework;Assist for transportation;Help with stairs or ramp for entrance    Equipment Recommendations None recommended by PT  Recommendations for Other Services       Functional Status Assessment Patient has had a recent decline in their functional status and demonstrates the ability to make significant improvements in function in a reasonable and predictable amount of time.     Precautions / Restrictions Precautions Precautions: Back Precaution Booklet Issued: Yes  (comment) Required Braces or Orthoses: Spinal Brace Spinal Brace: Lumbar corset;Applied in standing position Restrictions Weight Bearing Restrictions: No      Mobility  Bed Mobility Overal bed mobility: Needs Assistance Bed Mobility: Rolling, Sidelying to Sit Rolling: Supervision Sidelying to sit: Supervision       General bed mobility comments: HOB flat and rails lowered to simulate home environment. No assist required but supervision for safety and VC's throughout for optimal log roll technique.    Transfers Overall transfer level: Needs assistance Equipment used: Rolling walker (2 wheels) Transfers: Sit to/from Stand Sit to Stand: Supervision           General transfer comment: VC's for upright posture and wide BOS for sit<>stand transfer. Good power up to full stand without assistance.    Ambulation/Gait Ambulation/Gait assistance: Min guard, Supervision Gait Distance (Feet): 400 Feet Assistive device: Rolling walker (2 wheels) Gait Pattern/deviations: Step-through pattern, Decreased stride length, Trunk flexed Gait velocity: Decreased Gait velocity interpretation: <1.31 ft/sec, indicative of household ambulator   General Gait Details: VC's for improved posture, closer walker proximity, and forward gaze. No assist required. Progressed to supervision for safety by end of session.  Stairs Stairs: Yes Stairs assistance: Min guard Stair Management: One rail Left, Step to pattern Number of Stairs: 2 General stair comments: VC's for sequencing and general safety.  Wheelchair Mobility    Modified Rankin (Stroke Patients Only)       Balance Overall balance assessment: Mild deficits observed, not formally tested                                           Pertinent Vitals/Pain Pain Assessment Pain Assessment:  Faces Faces Pain Scale: Hurts a little bit Pain Location: operative site Pain Descriptors / Indicators: Aching, Sore, Operative site  guarding Pain Intervention(s): Limited activity within patient's tolerance, Monitored during session, Repositioned    Home Living Family/patient expects to be discharged to:: Private residence Living Arrangements: Spouse/significant other Available Help at Discharge: Family;Available 24 hours/day Type of Home: House Home Access: Stairs to enter Entrance Stairs-Rails: Left Entrance Stairs-Number of Steps: 2   Home Layout: Two level;Able to live on main level with bedroom/bathroom Home Equipment: Rolling Walker (2 wheels);BSC/3in1;Shower seat;Hand held shower head      Prior Function Prior Level of Function : Independent/Modified Independent;Working/employed;Driving             Mobility Comments: Pt is a [employer] manager        Extremity/Trunk Assessment   Upper Extremity Assessment Upper Extremity Assessment: Defer to OT evaluation    Lower Extremity Assessment Lower Extremity Assessment: Generalized weakness (Mild; consistent with pre-op diagnosis)    Cervical / Trunk Assessment Cervical / Trunk Assessment: Back Surgery  Communication   Communication: No difficulties  Cognition Arousal/Alertness: Awake/alert Behavior During Therapy: WFL for tasks assessed/performed Overall Cognitive Status: Within Functional Limits for tasks assessed                                          General Comments General comments (skin integrity, edema, etc.): VSS husband present    Exercises     Assessment/Plan    PT Assessment Patient needs continued PT services  PT Problem List Decreased strength;Decreased activity tolerance;Decreased balance;Decreased mobility;Decreased knowledge of use of DME;Decreased safety awareness;Decreased knowledge of precautions;Pain       PT Treatment Interventions DME instruction;Gait training;Stair training;Functional mobility training;Therapeutic activities;Therapeutic exercise;Balance  training;Patient/family education    PT Goals (Current goals can be found in the Care Plan section)  Acute Rehab PT Goals Patient Stated Goal: 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, 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 PT Goal Formulation: With patient/family Time For Goal Achievement: 06/28/22 Potential to Achieve Goals: Good    Frequency Min 5X/week     Co-evaluation               AM-PAC PT "6 Clicks" Mobility  Outcome Measure Help needed turning from your back to your side while in a flat bed without using bedrails?: A Little Help needed moving from lying on your back to sitting on the side of a flat bed without using bedrails?: A Little Help needed moving to and from a bed to a chair (including a wheelchair)?: A Little Help needed standing up from a chair using your arms (e.g., wheelchair or bedside chair)?: A Little Help needed to walk in hospital room?: A Little Help needed climbing 3-5 steps with a railing? : A Little 6 Click Score: 18    End of Session Equipment Utilized During Treatment: Gait belt;Back brace Activity Tolerance: Patient tolerated treatment well Patient left: in bed;with call bell/phone within reach;with family/visitor present Nurse Communication: Mobility status PT Visit Diagnosis: Unsteadiness on feet (R26.81);Pain Pain - part of body:  (back)    Time: 8295-6213 PT Time Calculation (min) (ACUTE ONLY): 15 min   Charges:   PT Evaluation $PT Eval Low Complexity: 1 Low          Conni Slipper, PT, DPT Acute Rehabilitation Services Secure Chat Preferred Office: [phone]   Vernona Rieger  D Azell Bill 06/21/2022, 10:43 AM

## 2022-06-21 NOTE — Evaluation (Signed)
Occupational Therapy Evaluation Patient Details Name: Isabella Stewart MRN: [id number] DOB: December 24, 1950 Today's Date: 06/21/2022   History of Present Illness Pt is a 72 y.o. female s/p decompressive lumbar laminectomy, hemi facetectomy, and foraminotomies L4-5 with posterior fixation and intertransverse arthrodesis. PMH significant for DMII, heart murmur, HTN, sleep apnea, back surgery.   Clinical Impression   PTA, pt lived with her husband and was independent. Upon eval, pt performing UB ADL with set-up and LB ADL with supervision. Pt educated and demonstrating use of compensatory techniques for UB ADL, bed mobility, grooming, shower transfers, toileting, and brace application. Husband available to assist as needed at discharge. Recommending discharge home with no OT follow upt at this time. Thank you for this order. OT to sign off.      Recommendations for follow up therapy are one component of a multi-disciplinary discharge planning process, led by the attending physician.  Recommendations may be updated based on patient status, additional functional criteria and insurance authorization.   Assistance Recommended at Discharge Intermittent Supervision/Assistance  Patient can return home with the following A little help with walking and/or transfers;A little help with bathing/dressing/bathroom;Help with stairs or ramp for entrance;Assist for transportation;Assistance with cooking/housework    Functional Status Assessment  Patient has had a recent decline in their functional status and demonstrates the ability to make significant improvements in function in a reasonable and predictable amount of time.  Equipment Recommendations  None recommended by OT    Recommendations for Other Services       Precautions / Restrictions Precautions Precautions: Back Precaution Booklet Issued: Yes (comment) Required Braces or Orthoses: Spinal Brace Spinal Brace: Lumbar corset Restrictions Weight Bearing  Restrictions: No      Mobility Bed Mobility Overal bed mobility: Needs Assistance Bed Mobility: Rolling, Sidelying to Sit Rolling: Supervision Sidelying to sit: Min guard       General bed mobility comments: cues for technique    Transfers Overall transfer level: Needs assistance Equipment used: Rolling walker (2 wheels) Transfers: Sit to/from Stand Sit to Stand: Supervision           General transfer comment: for safety      Balance Overall balance assessment: Mild deficits observed, not formally tested                                         ADL either performed or assessed with clinical judgement   ADL Overall ADL's : Needs assistance/impaired Eating/Feeding: Independent   Grooming: Supervision/safety;Standing   Upper Body Bathing: Sitting;Set up   Lower Body Bathing: Supervison/ safety;Sit to/from stand   Upper Body Dressing : Set up;Sitting   Lower Body Dressing: Supervision/safety;Sit to/from stand   Toilet Transfer: Supervision/safety;Rolling walker (2 wheels);Ambulation     Toileting - Clothing Manipulation Details (indicate cue type and reason): reviewed compensatory techniques Tub/ Shower Transfer: Tub transfer;Min guard;Ambulation;Rolling walker (2 wheels)   Functional mobility during ADLs: Supervision/safety;Rolling walker (2 wheels)       Vision Ability to See in Adequate Light: 0 Adequate Patient Visual Report: No change from baseline Vision Assessment?: No apparent visual deficits     Perception Perception Perception Tested?: No   Praxis Praxis Praxis tested?: Not tested    Pertinent Vitals/Pain Pain Assessment Pain Assessment: Faces Faces Pain Scale: Hurts a little bit Pain Location: operative site Pain Descriptors / Indicators: Aching, Sore, Operative site guarding Pain Intervention(s): Limited activity within patient's tolerance,  Monitored during session     Hand Dominance     Extremity/Trunk  Assessment Upper Extremity Assessment Upper Extremity Assessment: Overall WFL for tasks assessed   Lower Extremity Assessment Lower Extremity Assessment: Defer to PT evaluation   Cervical / Trunk Assessment Cervical / Trunk Assessment: Back Surgery   Communication Communication Communication: No difficulties   Cognition Arousal/Alertness: Awake/alert Behavior During Therapy: WFL for tasks assessed/performed Overall Cognitive Status: Within Functional Limits for tasks assessed                                       General Comments  VSS husband present    Exercises     Shoulder Instructions      Home Living Family/patient expects to be discharged to:: Private residence Living Arrangements: Spouse/significant other Available Help at Discharge: Family;Available 24 hours/day Type of Home: House Home Access: Stairs to enter [employer] of Steps: 2 Entrance Stairs-Rails: Left Home Layout: Two level;Able to live on main level with bedroom/bathroom     Bathroom Shower/Tub: Chief Strategy Officer: Standard     Home Equipment: Agricultural consultant (2 wheels);BSC/3in1;Shower seat;Hand held shower head          Prior Functioning/Environment Prior Level of Function : Independent/Modified Independent                        OT Problem List: Decreased strength;Decreased activity tolerance;Pain;Decreased knowledge of use of DME or AE;Decreased knowledge of precautions      OT Treatment/Interventions:      OT Goals(Current goals can be found in the care plan section) Acute Rehab OT Goals 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 OT Goal Formulation: With patient  OT Frequency:      Co-evaluation              AM-PAC OT "6 Clicks" Daily Activity     Outcome Measure Help from another person eating meals?: None Help from another person taking care of personal grooming?: A Little Help from another person  toileting, which includes using toliet, bedpan, or urinal?: A Little Help from another person bathing (including washing, rinsing, drying)?: A Little Help from another person to put on and taking off regular upper body clothing?: None Help from another person to put on and taking off regular lower body clothing?: A Little 6 Click Score: 20   End of Session Equipment Utilized During Treatment: Gait belt;Rolling walker (2 wheels);Back brace Nurse Communication: Mobility status  Activity Tolerance: Patient tolerated treatment well Patient left: in bed;with family/visitor present;with call bell/phone within reach  OT Visit Diagnosis: Unsteadiness on feet (R26.81);Muscle weakness (generalized) (M62.81)                Time: 7829-5621 OT Time Calculation (min): 22 min Charges:  OT General Charges $OT Visit: 1 Visit OT Evaluation $OT Eval Low Complexity: 1 Low  Tyler Deis, OTR/L [facility] Acute Rehabilitation Office: [phone]   Isabella Stewart 06/21/2022, 8:50 AM

## 2022-06-21 NOTE — Discharge Summary (Signed)
Physician Discharge Summary  Patient ID: Isabella Stewart MRN: [id number] DOB/AGE: [birth year] 70 y.o.  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 The patient tolerated the procedure well and was taken to the recovery room and then to the floor in stable condition. The hospital course was routine. There were no complications. The wound remained clean dry and intact. Pt had appropriate back soreness. No complaints of leg pain or new N/T/W. The patient remained afebrile with stable vital signs, and tolerated a regular diet. The patient continued to increase activities, and pain was well controlled with oral pain medications.   Consults: None  Significant Diagnostic Studies:  Results for orders placed or performed during the hospital encounter of 06/20/22  Glucose, capillary  Result Value Ref Range   Glucose-Capillary 72 70 - 99 mg/dL  Glucose, capillary  Result Value Ref Range   Glucose-Capillary 72 70 - 99 mg/dL  Glucose, capillary  Result Value Ref Range   Glucose-Capillary 78 70 - 99 mg/dL  Glucose, capillary  Result Value Ref Range   Glucose-Capillary 86 70 - 99 mg/dL  Glucose, capillary  Result Value Ref Range   Glucose-Capillary 207 (H) 70 - 99 mg/dL   Comment 1 Notify RN    Comment 2 Document in Chart   Glucose, capillary  Result Value Ref Range   Glucose-Capillary 89 70 - 99 mg/dL   Comment 1 Notify RN    Comment 2 Document in Chart   ABO/Rh  Result Value Ref Range   ABO/RH(D)      A POS Performed at [facility], [address] [place], [place] [address]     DG Lumbar Spine 2-3 Views  Result Date:  06/20/2022 CLINICAL DATA:  Fluoroscopic assistance for lumbar spinal fusion EXAM: LUMBAR SPINE - 2-3 VIEW COMPARISON:  02/17/2022 FINDINGS: Fluoroscopic images show posterior surgical fusion at L4-L5 level. There is anterolisthesis at L4-L5 level. Fluoroscopy time 40.5 seconds. Radiation dose 26.78 mGy. IMPRESSION: Fluoroscopic assistance was provided for posterior lumbar fusion at the L4-L5 level. Electronically Signed   By: Ernie Avena M.D.   On: 06/20/2022 14:50   DG C-Arm 1-60 Min-No Report  Result Date: 06/20/2022 Fluoroscopy was utilized by the requesting physician.  No radiographic interpretation.   DG C-Arm 1-60 Min-No Report  Result Date: 06/20/2022 Fluoroscopy was utilized by the requesting physician.  No radiographic interpretation.    Antibiotics:  Anti-infectives (From admission, onward)    Start     Dose/Rate Route Frequency Ordered Stop   06/20/22 1630  ceFAZolin (ANCEF) IVPB 2g/100 mL premix        2 g 200 mL/hr over 30 Minutes Intravenous Every 8 hours 06/20/22 1506 06/21/22 0040   06/20/22 0945  ceFAZolin (ANCEF) IVPB 2g/100 mL premix        2 g 200 mL/hr over 30 Minutes Intravenous On call to O.R. 06/20/22 0939 06/20/22 1234       Discharge Exam: Blood pressure 103/68, pulse 79, temperature 97.8 F (36.6 C), temperature source Oral, resp. rate 16, height 5\' 2"  (1.575 m), weight 77.6 kg, SpO2 100 %. Neurologic: Grossly normal Ambulating and voiding well incision cdi   Discharge Medications:   Allergies as of 06/21/2022  Reactions   Latex Rash   Premarin [conjugated Estrogens] Rash        Medication List     STOP taking these medications    Advil 200 MG tablet Generic drug: ibuprofen   traMADol 50 MG tablet Commonly known as: ULTRAM       TAKE these medications    aspirin EC 81 MG tablet Take 81 mg by mouth daily.   atorvastatin 40 MG tablet Commonly known as: LIPITOR Take 40 mg by mouth in the morning.   chlorthalidone 25 MG  tablet Commonly known as: HYGROTON Take 25 mg by mouth daily.   fosinopril 40 MG tablet Commonly known as: MONOPRIL Take 40 mg by mouth daily.   HYDROcodone-acetaminophen 5-325 MG tablet Commonly known as: NORCO/VICODIN Take 1 tablet by mouth every 4 (four) hours as needed for moderate pain.   metFORMIN 500 MG tablet Commonly known as: GLUCOPHAGE Take 1,000 mg by mouth 2 (two) times daily with a meal.   methocarbamol 750 MG tablet Commonly known as: Robaxin-750 Take 1 tablet (750 mg total) by mouth 4 (four) times daily.   pioglitazone 45 MG tablet Commonly known as: ACTOS Take 45 mg by mouth daily.               Durable Medical Equipment  (From admission, onward)           Start     Ordered   06/20/22 1555  DME Walker rolling  Once       Question:  Patient needs a walker to treat with the following condition  Answer:  S/P lumbar fusion   06/20/22 1554   06/20/22 1555  DME 3 n 1  Once        06/20/22 1554            Disposition: home   Final Dx: posterior lateral fusion L4-5  Discharge Instructions      Remove dressing in 72 hours   Complete by: As directed    Call MD for:  difficulty breathing, headache or visual disturbances   Complete by: As directed    Call MD for:  hives   Complete by: As directed    Call MD for:  persistant dizziness or light-headedness   Complete by: As directed    Call MD for:  persistant nausea and vomiting   Complete by: As directed    Call MD for:  redness, tenderness, or signs of infection (pain, swelling, redness, odor or green/yellow discharge around incision site)   Complete by: As directed    Call MD for:  severe uncontrolled pain   Complete by: As directed    Call MD for:  temperature >100.4   Complete by: As directed    Diet - low sodium heart healthy   Complete by: As directed    Driving Restrictions   Complete by: As directed    No driving for 2 weeks, no riding in the car for 1 week   Increase activity  slowly   Complete by: As directed    Lifting restrictions   Complete by: As directed    No lifting more than 8 lbs          Signed: Tiana Loft Pinchos Topel 06/21/2022, 7:52 AM

## 2022-11-14 ENCOUNTER — Other Ambulatory Visit: Payer: Self-pay | Admitting: Neurosurgery

## 2022-12-06 NOTE — Pre-Procedure Instructions (Signed)
Surgical Instructions   Your procedure is scheduled on December 12, 2022. Report to [facility] Main Entrance "A" at 5:30 A.M., then check in with the Admitting office. Any questions or running late day of surgery: call [phone]  Questions prior to your surgery date: call [phone], Monday-Friday, 8am-4pm. If you experience any cold or flu symptoms such as cough, fever, chills, shortness of breath, etc. between now and your scheduled surgery, please notify us at the above number.     Remember:  Do not eat or drink after midnight the night before your surgery    Take these medicines the morning of surgery with A SIP OF WATER: traMADol (ULTRAM) - may take if needed   Follow your surgeon's instructions on when to stop Aspirin.  If no instructions were given by your surgeon then you will need to call the office to get those instructions.     One week prior to surgery, STOP taking any Aleve, Naproxen, Ibuprofen, Motrin, Advil, Goody's, BC's, all herbal medications, fish oil, and non-prescription vitamins.   WHAT DO I DO ABOUT MY DIABETES MEDICATION?   Do not take metFORMIN (GLUCOPHAGE) or pioglitazone (ACTOS) the morning of surgery.   HOW TO MANAGE YOUR DIABETES BEFORE AND AFTER SURGERY  Why is it important to control my blood sugar before and after surgery? Improving blood sugar levels before and after surgery helps healing and can limit problems. A way of improving blood sugar control is eating a healthy diet by:  Eating less sugar and carbohydrates  Increasing activity/exercise  Talking with your doctor about reaching your blood sugar goals High blood sugars (greater than 180 mg/dL) can raise your risk of infections and slow your recovery, so you will need to focus on controlling your diabetes during the weeks before surgery. Make sure that the doctor who takes care of your diabetes knows about your planned surgery including the date and location.  How do I manage my  blood sugar before surgery? Check your blood sugar at least 4 times a day, starting 2 days before surgery, to make sure that the level is not too high or low.  Check your blood sugar the morning of your surgery when you wake up and every 2 hours until you get to the Short Stay unit.  If your blood sugar is less than 70 mg/dL, you will need to treat for low blood sugar: Do not take insulin. Treat a low blood sugar (less than 70 mg/dL) with  cup of clear juice (cranberry or apple), 4 glucose tablets, OR glucose gel. Recheck blood sugar in 15 minutes after treatment (to make sure it is greater than 70 mg/dL). If your blood sugar is not greater than 70 mg/dL on recheck, call [phone] for further instructions. Report your blood sugar to the short stay nurse when you get to Short Stay.  If you are admitted to the hospital after surgery: Your blood sugar will be checked by the staff and you will probably be given insulin after surgery (instead of oral diabetes medicines) to make sure you have good blood sugar levels. The goal for blood sugar control after surgery is 80-180 mg/dL.                      Do NOT Smoke (Tobacco/Vaping) for 24 hours prior to your procedure.  If you use a CPAP at night, you may bring your mask/headgear for your overnight stay.   You will be asked to remove any contacts,  glasses, piercing's, hearing aid's, dentures/partials prior to surgery. Please bring cases for these items if needed.    Patients discharged the day of surgery will not be allowed to drive home, and someone needs to stay with them for 24 hours.  SURGICAL WAITING ROOM VISITATION Patients may have no more than 2 support people in the waiting area - these visitors may rotate.   Pre-op nurse will coordinate an appropriate time for 1 ADULT support person, who may not rotate, to accompany patient in pre-op.  Children under the age of 50 must have an adult with them who is not the patient and must remain  in the main waiting area with an adult.  If the patient needs to stay at the hospital during part of their recovery, the visitor guidelines for inpatient rooms apply.  Please refer to the [facility] website for the visitor guidelines for any additional information.   If you received a COVID test during your pre-op visit  it is requested that you wear a mask when out in public, stay away from anyone that may not be feeling well and notify your surgeon if you develop symptoms. If you have been in contact with anyone that has tested positive in the last 10 days please notify you surgeon.      Pre-operative 5 CHG Bathing Instructions   You can play a key role in reducing the risk of infection after surgery. Your skin needs to be as free of germs as possible. You can reduce the number of germs on your skin by washing with CHG (chlorhexidine gluconate) soap before surgery. CHG is an antiseptic soap that kills germs and continues to kill germs even after washing.   DO NOT use if you have an allergy to chlorhexidine/CHG or antibacterial soaps. If your skin becomes reddened or irritated, stop using the CHG and notify one of our RNs at [phone].   Please shower with the CHG soap starting 4 days before surgery using the following schedule:     Please keep in mind the following:  DO NOT shave, including legs and underarms, starting the day of your first shower.   You may shave your face at any point before/day of surgery.  Place clean sheets on your bed the day you start using CHG soap. Use a clean washcloth (not used since being washed) for each shower. DO NOT sleep with pets once you start using the CHG.   CHG Shower Instructions:  Wash your face and private area with normal soap. If you choose to wash your hair, wash first with your normal shampoo.  After you use shampoo/soap, rinse your hair and body thoroughly to remove shampoo/soap residue.  Turn the water OFF and apply about 3  tablespoons (45 ml) of CHG soap to a CLEAN washcloth.  Apply CHG soap ONLY FROM YOUR NECK DOWN TO YOUR TOES (washing for 3-5 minutes)  DO NOT use CHG soap on face, private areas, open wounds, or sores.  Pay special attention to the area where your surgery is being performed.  If you are having back surgery, having someone wash your back for you may be helpful. Wait 2 minutes after CHG soap is applied, then you may rinse off the CHG soap.  Pat dry with a clean towel  Put on clean clothes/pajamas   If you choose to wear lotion, please use ONLY the CHG-compatible lotions on the back of this paper.   Additional instructions for the day of surgery: DO NOT  APPLY any lotions, deodorants, cologne, or perfumes.   Do not bring valuables to the hospital. [facility] is not responsible for any belongings/valuables. Do not wear nail polish, gel polish, artificial nails, or any other type of covering on natural nails (fingers and toes) Do not wear jewelry or makeup Put on clean/comfortable clothes.  Please brush your teeth.  Ask your nurse before applying any prescription medications to the skin.     CHG Compatible Lotions   Aveeno Moisturizing lotion  Cetaphil Moisturizing Cream  Cetaphil Moisturizing Lotion  Clairol Herbal Essence Moisturizing Lotion, Dry Skin  Clairol Herbal Essence Moisturizing Lotion, Extra Dry Skin  Clairol Herbal Essence Moisturizing Lotion, Normal Skin  Curel Age Defying Therapeutic Moisturizing Lotion with Alpha Hydroxy  Curel Extreme Care Body Lotion  Curel Soothing Hands Moisturizing Hand Lotion  Curel Therapeutic Moisturizing Cream, Fragrance-Free  Curel Therapeutic Moisturizing Lotion, Fragrance-Free  Curel Therapeutic Moisturizing Lotion, Original Formula  Eucerin Daily Replenishing Lotion  Eucerin Dry Skin Therapy Plus Alpha Hydroxy Crme  Eucerin Dry Skin Therapy Plus Alpha Hydroxy Lotion  Eucerin Original Crme  Eucerin Original Lotion  Eucerin Plus Crme  Eucerin Plus Lotion  Eucerin TriLipid Replenishing Lotion  Keri Anti-Bacterial Hand Lotion  Keri Deep Conditioning Original Lotion Dry Skin Formula Softly Scented  Keri Deep Conditioning Original Lotion, Fragrance Free Sensitive Skin Formula  Keri Lotion Fast Absorbing Fragrance Free Sensitive Skin Formula  Keri Lotion Fast Absorbing Softly Scented Dry Skin Formula  Keri Original Lotion  Keri Skin Renewal Lotion Keri Silky Smooth Lotion  Keri Silky Smooth Sensitive Skin Lotion  Nivea Body Creamy Conditioning Oil  Nivea Body Extra Enriched Lotion  Nivea Body Original Lotion  Nivea Body Sheer Moisturizing Lotion Nivea Crme  Nivea Skin Firming Lotion  NutraDerm 30 Skin Lotion  NutraDerm Skin Lotion  NutraDerm Therapeutic Skin Cream  NutraDerm Therapeutic Skin Lotion  ProShield Protective Hand Cream  Provon moisturizing lotion  Please read over the following fact sheets that you were given.

## 2022-12-07 ENCOUNTER — Encounter (HOSPITAL_COMMUNITY)
Admission: RE | Admit: 2022-12-07 | Discharge: 2022-12-07 | Disposition: A | Discharge status: Home / Self Care | Payer: Medicare Other | Source: Ambulatory Visit | Attending: Neurological Surgery | Admitting: Neurological Surgery

## 2022-12-07 ENCOUNTER — Other Ambulatory Visit: Payer: Self-pay

## 2022-12-07 ENCOUNTER — Encounter (HOSPITAL_COMMUNITY): Payer: Self-pay

## 2022-12-07 VITALS — BP 181/73 | HR 93 | Temp 98.1°F | Resp 20 | Ht 62.0 in | Wt 176.6 lb

## 2022-12-07 DIAGNOSIS — E119 Type 2 diabetes mellitus without complications: Secondary | ICD-10-CM | POA: Diagnosis not present

## 2022-12-07 DIAGNOSIS — Z01812 Encounter for preprocedural laboratory examination: Secondary | ICD-10-CM | POA: Diagnosis present

## 2022-12-07 DIAGNOSIS — Z01818 Encounter for other preprocedural examination: Secondary | ICD-10-CM

## 2022-12-07 LAB — SURGICAL PCR SCREEN
MRSA, PCR: NEGATIVE
Staphylococcus aureus: POSITIVE — AB

## 2022-12-07 LAB — HEMOGLOBIN A1C
Hgb A1c MFr Bld: 6.5 % — ABNORMAL HIGH (ref 4.8–5.6)
Mean Plasma Glucose: 139.85 mg/dL

## 2022-12-07 LAB — TYPE AND SCREEN
ABO/RH(D): A POS
Antibody Screen: NEGATIVE

## 2022-12-07 LAB — CBC
HCT: 31.9 % — ABNORMAL LOW (ref 36.0–46.0)
Hemoglobin: 10.3 g/dL — ABNORMAL LOW (ref 12.0–15.0)
MCH: 29.6 pg (ref 26.0–34.0)
MCHC: 32.3 g/dL (ref 30.0–36.0)
MCV: 91.7 fL (ref 80.0–100.0)
Platelets: 253 10*3/uL (ref 150–400)
RBC: 3.48 MIL/uL — ABNORMAL LOW (ref 3.87–5.11)
RDW: 14.1 % (ref 11.5–15.5)
WBC: 4 10*3/uL (ref 4.0–10.5)
nRBC: 0 % (ref 0.0–0.2)

## 2022-12-07 LAB — BASIC METABOLIC PANEL
Anion gap: 9 (ref 5–15)
BUN: 16 mg/dL (ref 8–23)
CO2: 27 mmol/L (ref 22–32)
Calcium: 9.8 mg/dL (ref 8.9–10.3)
Chloride: 104 mmol/L (ref 98–111)
Creatinine, Ser: 0.83 mg/dL (ref 0.44–1.00)
GFR, Estimated: >60 mL/min (ref >60)
Glucose, Bld: 107 mg/dL — ABNORMAL HIGH (ref 70–99)
Potassium: 3.9 mmol/L (ref 3.5–5.1)
Sodium: 140 mmol/L (ref 135–145)

## 2022-12-07 LAB — GLUCOSE, CAPILLARY: Glucose-Capillary: 99 mg/dL (ref 70–99)

## 2022-12-07 LAB — PROTIME-INR
INR: 1 (ref 0.8–1.2)
Prothrombin Time: 13.5 s (ref 11.4–15.2)

## 2022-12-07 NOTE — Progress Notes (Signed)
PCP - Dr. Irven [place] Cardiologist - Denies  PPM/ICD - Denies Device Orders - n/a Rep Notified - n/a  Chest x-ray - n/a EKG - 06/10/22 Stress Test - Denies ECHO - Per pt, many years ago Cardiac Cath - Denies  Sleep Study - +OSA. Pt unable to tolerate her CPAP due moving too much during the night which causes machine to alarm.  Pt is DM2. She checks her blood sugar 1x/week. Normal fasting glucose is around 128. CBG at pre-op 99. A1c result pending.  Last dose of GLP1 agonist- n/a GLP1 instructions: n/a  Blood Thinner Instructions: n/a Aspirin Instructions: Pts last dose of ASA was 12/06/22  NPO after midnight  COVID TEST- n/a   Anesthesia review: No.   Patient denies shortness of breath, fever, cough and chest pain at PAT appointment. Pt denies any respiratory illness/infection in the last two months.    All instructions explained to the patient, with a verbal understanding of the material. Patient agrees to go over the instructions while at home for a better understanding. Patient also instructed to self quarantine after being tested for COVID-19. The opportunity to ask questions was provided.

## 2022-12-12 ENCOUNTER — Ambulatory Visit (HOSPITAL_COMMUNITY): Payer: Medicare Other | Admitting: Anesthesiology

## 2022-12-12 ENCOUNTER — Observation Stay (HOSPITAL_COMMUNITY)
Admission: RE | Admit: 2022-12-12 | Discharge: 2022-12-13 | Disposition: A | Discharge status: Home / Self Care | Payer: Medicare Other | Attending: Neurological Surgery | Admitting: Neurological Surgery

## 2022-12-12 ENCOUNTER — Ambulatory Visit (HOSPITAL_COMMUNITY): Payer: Medicare Other

## 2022-12-12 ENCOUNTER — Other Ambulatory Visit: Payer: Self-pay

## 2022-12-12 ENCOUNTER — Encounter (HOSPITAL_COMMUNITY)
Admission: RE | Disposition: A | Discharge status: Home / Self Care | Payer: Self-pay | Source: Home / Self Care | Attending: Neurological Surgery

## 2022-12-12 ENCOUNTER — Ambulatory Visit (HOSPITAL_BASED_OUTPATIENT_CLINIC_OR_DEPARTMENT_OTHER): Payer: Medicare Other | Admitting: Anesthesiology

## 2022-12-12 ENCOUNTER — Encounter (HOSPITAL_COMMUNITY): Payer: Self-pay | Admitting: Neurological Surgery

## 2022-12-12 DIAGNOSIS — Z7982 Long term (current) use of aspirin: Secondary | ICD-10-CM | POA: Insufficient documentation

## 2022-12-12 DIAGNOSIS — E119 Type 2 diabetes mellitus without complications: Secondary | ICD-10-CM | POA: Insufficient documentation

## 2022-12-12 DIAGNOSIS — Z91040 Latex allergy status: Secondary | ICD-10-CM | POA: Insufficient documentation

## 2022-12-12 DIAGNOSIS — M48061 Spinal stenosis, lumbar region without neurogenic claudication: Secondary | ICD-10-CM | POA: Insufficient documentation

## 2022-12-12 DIAGNOSIS — I10 Essential (primary) hypertension: Secondary | ICD-10-CM | POA: Insufficient documentation

## 2022-12-12 DIAGNOSIS — Z7984 Long term (current) use of oral hypoglycemic drugs: Secondary | ICD-10-CM | POA: Diagnosis not present

## 2022-12-12 DIAGNOSIS — Z79899 Other long term (current) drug therapy: Secondary | ICD-10-CM | POA: Diagnosis not present

## 2022-12-12 DIAGNOSIS — M960 Pseudarthrosis after fusion or arthrodesis: Secondary | ICD-10-CM | POA: Insufficient documentation

## 2022-12-12 DIAGNOSIS — M4726 Other spondylosis with radiculopathy, lumbar region: Secondary | ICD-10-CM | POA: Diagnosis present

## 2022-12-12 DIAGNOSIS — Z981 Arthrodesis status: Principal | ICD-10-CM

## 2022-12-12 LAB — GLUCOSE, CAPILLARY
Glucose-Capillary: 86 mg/dL (ref 70–99)
Glucose-Capillary: 93 mg/dL (ref 70–99)
Glucose-Capillary: 178 mg/dL — ABNORMAL HIGH (ref 70–99)
Glucose-Capillary: 135 mg/dL — ABNORMAL HIGH (ref 70–99)

## 2022-12-12 SURGERY — POSTERIOR LUMBAR FUSION 1 LEVEL
Anesthesia: General | Site: Back

## 2022-12-12 MED ORDER — ACETAMINOPHEN 10 MG/ML IV SOLN
INTRAVENOUS | Status: AC
  Filled 2022-12-12: qty 100

## 2022-12-12 MED ORDER — METFORMIN HCL 500 MG PO TABS
1000.0000 mg | ORAL_TABLET | Freq: Two times a day (BID) | ORAL | Status: DC
  Administered 2022-12-12: 1000 mg via ORAL
  Filled 2022-12-12: qty 2

## 2022-12-12 MED ORDER — CELECOXIB 200 MG PO CAPS
200.0000 mg | ORAL_CAPSULE | Freq: Two times a day (BID) | ORAL | Status: DC
  Administered 2022-12-12 (×2): 200 mg via ORAL
  Filled 2022-12-12 (×2): qty 1

## 2022-12-12 MED ORDER — ORAL CARE MOUTH RINSE: 15.0000 mL | Freq: Once | OROMUCOSAL | Status: AC

## 2022-12-12 MED ORDER — ALBUMIN HUMAN 5 % IV SOLN: INTRAVENOUS | Status: DC | PRN

## 2022-12-12 MED ORDER — METHOCARBAMOL 1000 MG/10ML IJ SOLN: 500.0000 mg | Freq: Four times a day (QID) | INTRAMUSCULAR | Status: DC | PRN

## 2022-12-12 MED ORDER — ACETAMINOPHEN 650 MG RE SUPP: 650.0000 mg | RECTAL | Status: DC | PRN

## 2022-12-12 MED ORDER — FENTANYL CITRATE (PF) 250 MCG/5ML IJ SOLN
INTRAMUSCULAR | Status: AC
  Filled 2022-12-12: qty 5

## 2022-12-12 MED ORDER — HYDROCODONE-ACETAMINOPHEN 10-325 MG PO TABS
1.0000 | ORAL_TABLET | ORAL | Status: DC | PRN
  Administered 2022-12-12 – 2022-12-13 (×4): 1 via ORAL
  Filled 2022-12-12 (×4): qty 1

## 2022-12-12 MED ORDER — DEXTROSE-SODIUM CHLORIDE 5-0.45 % IV SOLN: INTRAVENOUS | Status: DC

## 2022-12-12 MED ORDER — OXYCODONE HCL 5 MG PO TABS: 5.0000 mg | ORAL_TABLET | Freq: Once | ORAL | Status: DC | PRN

## 2022-12-12 MED ORDER — SODIUM CHLORIDE 0.9% FLUSH: 3.0000 mL | Freq: Two times a day (BID) | INTRAVENOUS | Status: DC

## 2022-12-12 MED ORDER — PHENYLEPHRINE HCL-NACL 20-0.9 MG/250ML-% IV SOLN
INTRAVENOUS | Status: DC | PRN
  Administered 2022-12-12: 30 ug/min via INTRAVENOUS

## 2022-12-12 MED ORDER — MIDAZOLAM HCL 2 MG/2ML IJ SOLN
INTRAMUSCULAR | Status: DC | PRN
  Administered 2022-12-12: 2 mg via INTRAVENOUS

## 2022-12-12 MED ORDER — INSULIN ASPART 100 UNIT/ML IJ SOLN
0.0000 [IU] | Freq: Three times a day (TID) | INTRAMUSCULAR | Status: DC
  Administered 2022-12-12: 3 [IU] via SUBCUTANEOUS
  Administered 2022-12-13: 2 [IU] via SUBCUTANEOUS

## 2022-12-12 MED ORDER — LACTATED RINGERS IV SOLN: INTRAVENOUS | Status: DC

## 2022-12-12 MED ORDER — THROMBIN 5000 UNITS EX SOLR
CUTANEOUS | Status: AC
  Filled 2022-12-12: qty 5000

## 2022-12-12 MED ORDER — MENTHOL 3 MG MT LOZG: 1.0000 | LOZENGE | OROMUCOSAL | Status: DC | PRN

## 2022-12-12 MED ORDER — PROPOFOL 10 MG/ML IV BOLUS
INTRAVENOUS | Status: DC | PRN
  Administered 2022-12-12: 100 mg via INTRAVENOUS

## 2022-12-12 MED ORDER — CEFAZOLIN SODIUM-DEXTROSE 2-4 GM/100ML-% IV SOLN
2.0000 g | INTRAVENOUS | Status: AC
  Administered 2022-12-12: 2 g via INTRAVENOUS
  Filled 2022-12-12: qty 100

## 2022-12-12 MED ORDER — HYDROXYZINE HCL 50 MG/ML IM SOLN: 50.0000 mg | Freq: Four times a day (QID) | INTRAMUSCULAR | Status: DC | PRN

## 2022-12-12 MED ORDER — SUGAMMADEX SODIUM 200 MG/2ML IV SOLN
INTRAVENOUS | Status: DC | PRN
  Administered 2022-12-12: 400 mg via INTRAVENOUS

## 2022-12-12 MED ORDER — SODIUM CHLORIDE 0.9 % IV SOLN
250.0000 mL | INTRAVENOUS | Status: DC
  Administered 2022-12-12: 250 mL via INTRAVENOUS

## 2022-12-12 MED ORDER — INSULIN ASPART 100 UNIT/ML IJ SOLN: 0.0000 [IU] | Freq: Every day | INTRAMUSCULAR | Status: DC

## 2022-12-12 MED ORDER — LIDOCAINE 2% (20 MG/ML) 5 ML SYRINGE
INTRAMUSCULAR | Status: AC
  Filled 2022-12-12: qty 5

## 2022-12-12 MED ORDER — ONDANSETRON HCL 4 MG/2ML IJ SOLN: 4.0000 mg | Freq: Four times a day (QID) | INTRAMUSCULAR | Status: DC | PRN

## 2022-12-12 MED ORDER — CHLORHEXIDINE GLUCONATE CLOTH 2 % EX PADS: 6.0000 | MEDICATED_PAD | Freq: Once | CUTANEOUS | Status: DC

## 2022-12-12 MED ORDER — SODIUM CHLORIDE 0.9% FLUSH: 3.0000 mL | INTRAVENOUS | Status: DC | PRN

## 2022-12-12 MED ORDER — OXYCODONE HCL 5 MG/5ML PO SOLN: 5.0000 mg | Freq: Once | ORAL | Status: DC | PRN

## 2022-12-12 MED ORDER — PIOGLITAZONE HCL 15 MG PO TABS
45.0000 mg | ORAL_TABLET | Freq: Every day | ORAL | Status: DC
  Administered 2022-12-12: 45 mg via ORAL
  Filled 2022-12-12: qty 3

## 2022-12-12 MED ORDER — LISINOPRIL 20 MG PO TABS: 40.0000 mg | ORAL_TABLET | Freq: Every day | ORAL | Status: DC

## 2022-12-12 MED ORDER — THROMBIN 20000 UNITS EX SOLR
CUTANEOUS | Status: DC | PRN
  Administered 2022-12-12: 20 mL

## 2022-12-12 MED ORDER — FENTANYL CITRATE (PF) 100 MCG/2ML IJ SOLN
INTRAMUSCULAR | Status: AC
  Filled 2022-12-12: qty 2

## 2022-12-12 MED ORDER — PHENOL 1.4 % MT LIQD: 1.0000 | OROMUCOSAL | Status: DC | PRN

## 2022-12-12 MED ORDER — METHOCARBAMOL 500 MG PO TABS: 500.0000 mg | ORAL_TABLET | Freq: Four times a day (QID) | ORAL | Status: DC | PRN

## 2022-12-12 MED ORDER — BUPIVACAINE HCL (PF) 0.25 % IJ SOLN
INTRAMUSCULAR | Status: DC | PRN
  Administered 2022-12-12: 5 mL

## 2022-12-12 MED ORDER — THROMBIN 5000 UNITS EX SOLR
OROMUCOSAL | Status: DC | PRN
  Administered 2022-12-12: 5 mL

## 2022-12-12 MED ORDER — FENTANYL CITRATE (PF) 100 MCG/2ML IJ SOLN
25.0000 ug | INTRAMUSCULAR | Status: DC | PRN
  Administered 2022-12-12 (×2): 50 ug via INTRAVENOUS

## 2022-12-12 MED ORDER — LIDOCAINE 2% (20 MG/ML) 5 ML SYRINGE
INTRAMUSCULAR | Status: DC | PRN
  Administered 2022-12-12: 100 mg via INTRAVENOUS

## 2022-12-12 MED ORDER — MIDAZOLAM HCL 2 MG/2ML IJ SOLN
INTRAMUSCULAR | Status: AC
Start: 2022-12-12 — End: ?
  Filled 2022-12-12: qty 2

## 2022-12-12 MED ORDER — ONDANSETRON HCL 4 MG/2ML IJ SOLN
INTRAMUSCULAR | Status: AC
  Filled 2022-12-12: qty 2

## 2022-12-12 MED ORDER — PROPOFOL 10 MG/ML IV BOLUS
INTRAVENOUS | Status: AC
  Filled 2022-12-12: qty 20

## 2022-12-12 MED ORDER — CHLORTHALIDONE 25 MG PO TABS
25.0000 mg | ORAL_TABLET | Freq: Every day | ORAL | Status: DC
  Administered 2022-12-12: 25 mg via ORAL
  Filled 2022-12-12: qty 1

## 2022-12-12 MED ORDER — ONDANSETRON HCL 4 MG/2ML IJ SOLN
INTRAMUSCULAR | Status: DC | PRN
  Administered 2022-12-12: 4 mg via INTRAVENOUS

## 2022-12-12 MED ORDER — 0.9 % SODIUM CHLORIDE (POUR BTL) OPTIME
TOPICAL | Status: DC | PRN
  Administered 2022-12-12: 1000 mL

## 2022-12-12 MED ORDER — ASPIRIN 81 MG PO TBEC
81.0000 mg | DELAYED_RELEASE_TABLET | Freq: Every day | ORAL | Status: DC
  Administered 2022-12-12: 81 mg via ORAL
  Filled 2022-12-12: qty 1

## 2022-12-12 MED ORDER — INSULIN ASPART 100 UNIT/ML IJ SOLN: 0.0000 [IU] | INTRAMUSCULAR | Status: DC | PRN

## 2022-12-12 MED ORDER — ROCURONIUM BROMIDE 10 MG/ML (PF) SYRINGE
PREFILLED_SYRINGE | INTRAVENOUS | Status: DC | PRN
  Administered 2022-12-12: 20 mg via INTRAVENOUS
  Administered 2022-12-12: 80 mg via INTRAVENOUS

## 2022-12-12 MED ORDER — THROMBIN 20000 UNITS EX SOLR
CUTANEOUS | Status: AC
  Filled 2022-12-12: qty 20000

## 2022-12-12 MED ORDER — ACETAMINOPHEN 10 MG/ML IV SOLN
1000.0000 mg | Freq: Once | INTRAVENOUS | Status: DC | PRN
  Administered 2022-12-12: 1000 mg via INTRAVENOUS

## 2022-12-12 MED ORDER — FENTANYL CITRATE (PF) 250 MCG/5ML IJ SOLN
INTRAMUSCULAR | Status: DC | PRN
  Administered 2022-12-12 (×2): 50 ug via INTRAVENOUS
  Administered 2022-12-12: 100 ug via INTRAVENOUS
  Administered 2022-12-12: 50 ug via INTRAVENOUS

## 2022-12-12 MED ORDER — ONDANSETRON HCL 4 MG PO TABS: 4.0000 mg | ORAL_TABLET | Freq: Four times a day (QID) | ORAL | Status: DC | PRN

## 2022-12-12 MED ORDER — ACETAMINOPHEN 325 MG PO TABS: 650.0000 mg | ORAL_TABLET | ORAL | Status: DC | PRN

## 2022-12-12 MED ORDER — PHENYLEPHRINE 80 MCG/ML (10ML) SYRINGE FOR IV PUSH (FOR BLOOD PRESSURE SUPPORT)
PREFILLED_SYRINGE | INTRAVENOUS | Status: DC | PRN
  Administered 2022-12-12 (×3): 80 ug via INTRAVENOUS

## 2022-12-12 MED ORDER — BUPIVACAINE HCL (PF) 0.25 % IJ SOLN
INTRAMUSCULAR | Status: AC
  Filled 2022-12-12: qty 30

## 2022-12-12 MED ORDER — DEXAMETHASONE SODIUM PHOSPHATE 10 MG/ML IJ SOLN
INTRAMUSCULAR | Status: AC
  Filled 2022-12-12: qty 1

## 2022-12-12 MED ORDER — ONDANSETRON HCL 4 MG/2ML IJ SOLN: 4.0000 mg | Freq: Once | INTRAMUSCULAR | Status: DC | PRN

## 2022-12-12 MED ORDER — CHLORHEXIDINE GLUCONATE 0.12 % MT SOLN
15.0000 mL | Freq: Once | OROMUCOSAL | Status: AC
Start: 2022-12-12 — End: 2022-12-12
  Administered 2022-12-12: 15 mL via OROMUCOSAL
  Filled 2022-12-12: qty 15

## 2022-12-12 MED ORDER — SENNA 8.6 MG PO TABS
1.0000 | ORAL_TABLET | Freq: Two times a day (BID) | ORAL | Status: DC
  Administered 2022-12-12 (×2): 8.6 mg via ORAL
  Filled 2022-12-12 (×2): qty 1

## 2022-12-12 MED ORDER — DEXAMETHASONE SODIUM PHOSPHATE 10 MG/ML IJ SOLN
INTRAMUSCULAR | Status: DC | PRN
  Administered 2022-12-12: 5 mg via INTRAVENOUS

## 2022-12-12 MED ORDER — ROCURONIUM BROMIDE 10 MG/ML (PF) SYRINGE
PREFILLED_SYRINGE | INTRAVENOUS | Status: AC
  Filled 2022-12-12: qty 10

## 2022-12-12 MED ORDER — SURGIPHOR WOUND IRRIGATION SYSTEM - OPTIME
TOPICAL | Status: DC | PRN
  Administered 2022-12-12: 450 mL

## 2022-12-12 MED ORDER — CEFAZOLIN SODIUM-DEXTROSE 2-4 GM/100ML-% IV SOLN
2.0000 g | Freq: Three times a day (TID) | INTRAVENOUS | Status: AC
  Administered 2022-12-12 – 2022-12-13 (×2): 2 g via INTRAVENOUS
  Filled 2022-12-12 (×2): qty 100

## 2022-12-12 SURGICAL SUPPLY — 61 items
ALLOGRAFT BONE FIBER KORE 5 (Bone Implant) IMPLANT
BAG COUNTER SPONGE SURGICOUNT (BAG) ×1 IMPLANT
BASKET BONE COLLECTION (BASKET) ×1 IMPLANT
BENZOIN TINCTURE PRP APPL 2/3 (GAUZE/BANDAGES/DRESSINGS) ×1 IMPLANT
BLADE BONE MILL MEDIUM (MISCELLANEOUS) ×1 IMPLANT
BLADE CLIPPER SURG (BLADE) IMPLANT
BUR CARBIDE MATCH 3.0 (BURR) ×1 IMPLANT
CANISTER SUCT 3000ML PPV (MISCELLANEOUS) ×1 IMPLANT
CATH FOLEY LF 16FR (CATHETERS) IMPLANT
CLSR STERI-STRIP ANTIMIC 1/2X4 (GAUZE/BANDAGES/DRESSINGS) IMPLANT
CNTNR URN SCR LID CUP LEK RST (MISCELLANEOUS) ×1 IMPLANT
COVER BACK TABLE 60X90IN (DRAPES) ×1 IMPLANT
DERMABOND ADVANCED .7 DNX12 (GAUZE/BANDAGES/DRESSINGS) ×1 IMPLANT
DRAPE C-ARM 42X72 X-RAY (DRAPES) ×2 IMPLANT
DRAPE C-ARMOR (DRAPES) ×1 IMPLANT
DRAPE LAPAROTOMY 100X72X124 (DRAPES) ×1 IMPLANT
DRAPE SURG 17X23 STRL (DRAPES) ×1 IMPLANT
DRSG OPSITE POSTOP 4X6 (GAUZE/BANDAGES/DRESSINGS) IMPLANT
DURAPREP 26ML APPLICATOR (WOUND CARE) ×1 IMPLANT
ELECT REM PT RETURN 9FT ADLT (ELECTROSURGICAL) ×1
ELECTRODE REM PT RTRN 9FT ADLT (ELECTROSURGICAL) ×1 IMPLANT
EVACUATOR 1/8 PVC DRAIN (DRAIN) ×1 IMPLANT
GAUZE 4X4 16PLY ~~LOC~~+RFID DBL (SPONGE) IMPLANT
GLOVE BIO SURGEON STRL SZ7 (GLOVE) IMPLANT
GLOVE BIO SURGEON STRL SZ8 (GLOVE) ×2 IMPLANT
GLOVE BIOGEL PI IND STRL 7.0 (GLOVE) IMPLANT
GOWN STRL REUS W/ TWL LRG LVL3 (GOWN DISPOSABLE) IMPLANT
GOWN STRL REUS W/ TWL XL LVL3 (GOWN DISPOSABLE) ×2 IMPLANT
GOWN STRL REUS W/TWL 2XL LVL3 (GOWN DISPOSABLE) IMPLANT
GRAFT BN 5X1XSPNE CVD POST DBM (Bone Implant) IMPLANT
HEMOSTAT POWDER KIT SURGIFOAM (HEMOSTASIS) ×1 IMPLANT
HEMOSTAT POWDER SURGIFOAM 1G (HEMOSTASIS) IMPLANT
KIT BASIN OR (CUSTOM PROCEDURE TRAY) ×1 IMPLANT
KIT INFUSE SMALL (Orthopedic Implant) IMPLANT
KIT POSITION SURG JACKSON T1 (MISCELLANEOUS) ×1 IMPLANT
KIT TURNOVER KIT B (KITS) ×1 IMPLANT
MILL BONE PREP (MISCELLANEOUS) ×1 IMPLANT
NDL HYPO 25X1 1.5 SAFETY (NEEDLE) ×1 IMPLANT
NEEDLE HYPO 25X1 1.5 SAFETY (NEEDLE) ×1 IMPLANT
NS IRRIG 1000ML POUR BTL (IV SOLUTION) ×1 IMPLANT
PACK LAMINECTOMY NEURO (CUSTOM PROCEDURE TRAY) ×1 IMPLANT
PAD ARMBOARD 7.5X6 YLW CONV (MISCELLANEOUS) ×3 IMPLANT
PATTIES SURGICAL .5 X1 (DISPOSABLE) IMPLANT
PATTIES SURGICAL 1X1 (DISPOSABLE) IMPLANT
ROD LORD LIPPED TI 5.5X40 (Rod) IMPLANT
ROD LORD LIPPED TI 5.5X60 (Rod) IMPLANT
SCREW CORT SHANK MOD 6.5X40 (Screw) IMPLANT
SCREW KODIAK 7.5X40 (Screw) IMPLANT
SCREW POLYAXIAL TULIP (Screw) IMPLANT
SET SCREW SPNE (Screw) IMPLANT
SOLUTION IRRIG SURGIPHOR (IV SOLUTION) ×1 IMPLANT
SPACER IDENTITI 9X9X25 5D (Spacer) IMPLANT
SPONGE SURGIFOAM ABS GEL 100 (HEMOSTASIS) ×1 IMPLANT
SPONGE T-LAP 4X18 ~~LOC~~+RFID (SPONGE) IMPLANT
STRIP CLOSURE SKIN 1/2X4 (GAUZE/BANDAGES/DRESSINGS) ×2 IMPLANT
SUT VIC AB 0 CT1 18XCR BRD8 (SUTURE) ×1 IMPLANT
SUT VIC AB 2-0 CP2 18 (SUTURE) ×1 IMPLANT
SUT VIC AB 3-0 SH 8-18 (SUTURE) ×2 IMPLANT
TOWEL GREEN STERILE (TOWEL DISPOSABLE) ×1 IMPLANT
TOWEL GREEN STERILE FF (TOWEL DISPOSABLE) ×1 IMPLANT
WATER STERILE IRR 1000ML POUR (IV SOLUTION) ×1 IMPLANT

## 2022-12-12 NOTE — Anesthesia Postprocedure Evaluation (Signed)
Anesthesia Post Note  Patient: Isabella Stewart  Procedure(s) Performed: PLIF - L3-L4 - Posterior Lateral and Interbody fusion with extension of hardware (Back)     Patient location during evaluation: PACU Anesthesia Type: General Level of consciousness: awake and alert Pain management: pain level controlled Vital Signs Assessment: post-procedure vital signs reviewed and stable Respiratory status: spontaneous breathing, nonlabored ventilation, respiratory function stable and patient connected to nasal cannula oxygen Cardiovascular status: blood pressure returned to baseline and stable Postop Assessment: no apparent nausea or vomiting Anesthetic complications: no   No notable events documented.  Last Vitals:  Vitals:   12/12/22 1145 12/12/22 1223  BP: (!) 153/71 (!) 183/75  Pulse: 76 78  Resp: 10 20  Temp: 36.6 C 36.6 C  SpO2: 93% 94%    Last Pain:  Vitals:   12/12/22 1328  TempSrc:   PainSc: 6                  Mariann Barter

## 2022-12-12 NOTE — Anesthesia Procedure Notes (Signed)
Procedure Name: Intubation Date/Time: 12/12/2022 7:56 AM  Performed by: Orlin Hilding, CRNAPre-anesthesia Checklist: Patient identified, Emergency Drugs available, Suction available, Patient being monitored and Timeout performed Patient Re-evaluated:Patient Re-evaluated prior to induction Oxygen Delivery Method: Circle system utilized Preoxygenation: Pre-oxygenation with 100% oxygen Induction Type: IV induction Ventilation: Mask ventilation without difficulty and Oral airway inserted - appropriate to patient size Laryngoscope Size: Mac and 3 Grade View: Grade I Tube type: Oral Tube size: 7.0 mm Number of attempts: 1 Placement Confirmation: ETT inserted through vocal cords under direct vision, positive ETCO2 and breath sounds checked- equal and bilateral Secured at: 21 cm Tube secured with: Tape Dental Injury: Teeth and Oropharynx as per pre-operative assessment

## 2022-12-12 NOTE — Anesthesia Preprocedure Evaluation (Addendum)
Anesthesia Evaluation  Patient identified by MRN, date of birth, ID band Patient awake    Reviewed: Allergy & Precautions, NPO status , Patient's Chart, lab work & pertinent test results, reviewed documented beta blocker date and time   History of Anesthesia Complications Negative for: history of anesthetic complications  Airway Mallampati: III  TM Distance: >3 FB     Dental no notable dental hx.    Pulmonary sleep apnea    breath sounds clear to auscultation       Cardiovascular Exercise Tolerance: Poor hypertension, (-) angina (-) CAD, (-) Past MI, (-) Cardiac Stents, (-) CABG and (-) CHF + Valvular Problems/Murmurs  Rhythm:Regular Rate:Normal     Neuro/Psych    GI/Hepatic hiatal hernia,neg GERD  ,,(+) neg Cirrhosis        Endo/Other  diabetes    Renal/GU Renal disease     Musculoskeletal   Abdominal   Peds  Hematology   Anesthesia Other Findings   Reproductive/Obstetrics                             Anesthesia Physical Anesthesia Plan  ASA: 2  Anesthesia Plan: General   Post-op Pain Management:    Induction: Intravenous  PONV Risk Score and Plan: 3 and Ondansetron and Dexamethasone  Airway Management Planned: Oral ETT  Additional Equipment:   Intra-op Plan:   Post-operative Plan: Extubation in OR  Informed Consent: I have reviewed the patients History and Physical, chart, labs and discussed the procedure including the risks, benefits and alternatives for the proposed anesthesia with the patient or authorized representative who has indicated his/her understanding and acceptance.     Dental advisory given  Plan Discussed with: CRNA  Anesthesia Plan Comments:        Anesthesia Quick Evaluation

## 2022-12-12 NOTE — H&P (Signed)
Subjective: Patient is a 72 y.o. female admitted for plif L3-4. Onset of symptoms was several months ago, gradually worsening since that time.  The pain is rated severe, and is located at the across the lower back and radiates to thighs. The pain is described as aching and occurs all day. The symptoms have been progressive. Symptoms are exacerbated by nothing in particular. MRI or CT showed adjacent level stenosis L3-4   Past Medical History:  Diagnosis Date   Diabetes mellitus without complication (HCC)    type 2   Heart murmur    History of kidney stones    Hypertension    Sleep apnea     Past Surgical History:  Procedure Laterality Date   BACK SURGERY  06/2022   Dr. Yetta Barre   COLONOSCOPY     CYSTOSCOPY W/ URETERAL STENT PLACEMENT Left 04/08/2016   Procedure: CYSTOSCOPY WITH RETROGRADE PYELOGRAM/URETERAL STENT PLACEMENT;  Surgeon: Malen Gauze, MD;  Location: [facility];  Service: Urology;  Laterality: Left;   CYSTOSCOPY WITH RETROGRADE PYELOGRAM, URETEROSCOPY AND STENT PLACEMENT Left 05/02/2016   Procedure: CYSTOSCOPY WITH RETROGRADE PYELOGRAM, URETEROSCOPY AND STENT REPLACEMENT;  Surgeon: Malen Gauze, MD;  Location: [facility];  Service: Urology;  Laterality: Left;  1 HR [phone] [id number]   MENISCUS REPAIR Left     Prior to Admission medications   Medication Sig Start Date End Date Taking? Authorizing Provider  aspirin EC 81 MG tablet Take 81 mg by mouth daily.   Yes [provider]  chlorthalidone (HYGROTON) 25 MG tablet Take 25 mg by mouth daily.   Yes [provider]  fosinopril (MONOPRIL) 40 MG tablet Take 80 mg by mouth in the morning and at bedtime.   Yes [provider]  metFORMIN (GLUCOPHAGE) 500 MG tablet Take 1,000 mg by mouth 2 (two) times daily with a meal.   Yes [provider]  pioglitazone (ACTOS) 45 MG tablet Take 45 mg by mouth daily.   Yes [provider]  traMADol (ULTRAM) 50 MG tablet Take 50 mg by  mouth every 6 (six) hours as needed for moderate pain (pain score 4-6). 07/05/22  Yes [provider]  HYDROcodone-acetaminophen (NORCO/VICODIN) 5-325 MG tablet Take 1 tablet by mouth every 4 (four) hours as needed for moderate pain. Patient not taking: Reported on 12/02/2022 06/21/22 06/21/23  Sherryl Manges, NP  methocarbamol (ROBAXIN-750) 750 MG tablet Take 1 tablet (750 mg total) by mouth 4 (four) times daily. Patient not taking: Reported on 12/02/2022 06/21/22   Meyran, Tiana Loft, NP   Allergies  Allergen Reactions   Latex Rash   Premarin [Conjugated Estrogens] Rash    Social History   Tobacco Use   Smoking status: Never   Smokeless tobacco: Never  Substance Use Topics   Alcohol use: No    History reviewed. No pertinent family history.   Review of Systems  Positive ROS: neg  All other systems have been reviewed and were otherwise negative with the exception of those mentioned in the HPI and as above.  Objective: Vital signs in last 24 hours: Temp:  [97.6 F (36.4 C)] 97.6 F (36.4 C) (11/25 0550) Pulse Rate:  [79] 79 (11/25 0550) Resp:  [20] 20 (11/25 0550) BP: (158)/(78) 158/78 (11/25 0550) SpO2:  [96 %] 96 % (11/25 0550) Weight:  [79.4 kg] 79.4 kg (11/25 0550)  General Appearance: Alert, cooperative, no distress, appears stated age Head: Normocephalic, without obvious abnormality, atraumatic Eyes: PERRL, conjunctiva/corneas clear, EOM's intact    Neck:  Supple, symmetrical, trachea midline Back: Symmetric, no curvature, ROM normal, no CVA tenderness Lungs:  respirations unlabored Heart: Regular rate and rhythm Abdomen: Soft, non-tender Extremities: Extremities normal, atraumatic, no cyanosis or edema Pulses: 2+ and symmetric all extremities Skin: Skin color, texture, turgor normal, no rashes or lesions  NEUROLOGIC:   Mental status: Alert and oriented x4,  no aphasia, good attention span, fund of knowledge, and memory Motor Exam - grossly  normal Sensory Exam - grossly normal Reflexes: 1= Coordination - grossly normal Gait - grossly normal Balance - grossly normal Cranial Nerves: I: smell Not tested  II: visual acuity  OS: nl    OD: nl  II: visual fields Full to confrontation  II: pupils Equal, round, reactive to light  III,VII: ptosis None  III,IV,VI: extraocular muscles  Full ROM  V: mastication Normal  V: facial light touch sensation  Normal  V,VII: corneal reflex  Present  VII: facial muscle function - upper  Normal  VII: facial muscle function - lower Normal  VIII: hearing Not tested  IX: soft palate elevation  Normal  IX,X: gag reflex Present  XI: trapezius strength  5/5  XI: sternocleidomastoid strength 5/5  XI: neck flexion strength  5/5  XII: tongue strength  Normal    Data Review Lab Results  Component Value Date   WBC 4.0 12/07/2022   HGB 10.3 (L) 12/07/2022   HCT 31.9 (L) 12/07/2022   MCV 91.7 12/07/2022   PLT 253 12/07/2022   Lab Results  Component Value Date   NA 140 12/07/2022   K 3.9 12/07/2022   CL 104 12/07/2022   CO2 27 12/07/2022   BUN 16 12/07/2022   CREATININE 0.83 12/07/2022   GLUCOSE 107 (H) 12/07/2022   Lab Results  Component Value Date   INR 1.0 12/07/2022    Assessment/Plan:  Estimated body mass index is 32.01 kg/m as calculated from the following:   Height as of this encounter: 5\' 2"  (1.575 m).   Weight as of this encounter: 79.4 kg. Patient admitted for PLIF L3-4. Patient has failed a reasonable attempt at conservative therapy.  I explained the condition and procedure to the patient and answered any questions.  Patient wishes to proceed with procedure as planned. Understands risks/ benefits and typical outcomes of procedure.   Tia Alert 12/12/2022 7:34 AM

## 2022-12-12 NOTE — Transfer of Care (Signed)
Immediate Anesthesia Transfer of Care Note  Patient: Isabella Stewart  Procedure(s) Performed: PLIF - L3-L4 - Posterior Lateral and Interbody fusion with extension of hardware (Back)  Patient Location: PACU  Anesthesia Type:General  Level of Consciousness: awake and sedated  Airway & Oxygen Therapy: Patient Spontanous Breathing and Patient connected to face mask oxygen  Post-op Assessment: Report given to RN and Post -op Vital signs reviewed and stable  Post vital signs: Reviewed and stable  Last Vitals:  Vitals Value Taken Time  BP 151/69 12/12/22 1101  Temp    Pulse 84 12/12/22 1106  Resp 15 12/12/22 1106  SpO2 100 % 12/12/22 1106  Vitals shown include unfiled device data.  Last Pain:  Vitals:   12/12/22 0600  TempSrc:   PainSc: 0-No pain         Complications: No notable events documented.

## 2022-12-12 NOTE — Op Note (Signed)
12/12/2022  11:03 AM  PATIENT:  Isabella Stewart  72 y.o. 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 Decompressive lumbar laminectomy, hemi facetectomy and foraminotomies L3-4 requiring more work than would be required for a simple exposure of the disk for PLIF in order to adequately decompress the neural elements and address the spinal stenosis 2. Posterior lumbar interbody fusion L3-4 using PTI interbody cages packed with morcellized allograft and autograft  3. Posterior fixation L3-L5 using Alphatec cortical pedicle screws.  4. Intertransverse arthrodesis L3 to L5 using morcellized autograft and allograft. 5.  Exploration of fusion L4-5 with removal of loosened or broken instrumentation  SURGEON:  Marikay Alar, MD  ASSISTANTS: Verlin Dike, FNP  ANESTHESIA:  General  EBL: 250 ml  Total I/O In: 1250 [I.V.:1000; IV Piggyback:250] Out: 750 [Urine:500; Blood:250]  BLOOD ADMINISTERED:none  DRAINS: none   INDICATION FOR PROCEDURE: This patient presented with back pain and bilateral leg pain. Imaging revealed patient level foraminal stenosis at L3-4 compressing the bilateral L3 nerve roots.  Nerve conduction study showed bilateral L3 radiculopathy.  The patient tried a reasonable attempt at conservative medical measures without relief. I recommended decompression and instrumented fusion to address the stenosis as well as the segmental  instability.  Patient understood the risks, benefits, and alternatives and potential outcomes and wished to proceed.  PROCEDURE DETAILS:  The patient was brought to the operating room. After induction of generalized endotracheal anesthesia the patient was rolled into the prone position on chest rolls and all pressure points were padded. The patient's lumbar region was cleaned and then  prepped with DuraPrep and draped in the usual sterile fashion. Anesthesia was injected and then a dorsal midline incision was made and carried down to the lumbosacral fascia. The fascia was opened and the paraspinous musculature was taken down in a subperiosteal fashion to expose L3-4 and the previously placed instrumentation. A self-retaining retractor was placed.  Remove the locking caps from the previously placed pedicle screws at L4 and L5 bilaterally.  We found that the left L4 screw was loosened, the right L4 screw had good purchase, the left L5 screw had good purchase, and the right L5 screw had broken.  We removed the loosened left L4 pedicle screw and removed the broken tulip head from the L5 pedicle screw on the right.  I pulled on the spinous process of L4 and there was obvious motion at L4-5 confirming pseudoarthrosis.  Also explored the posterior lateral space and found no solid bridging bone between the L4 and L5 transverse processes on either side.  Intraoperative fluoroscopy confirmed my level, and I started with placement of the L3 cortical pedicle screws. The pedicle screw entry zones were identified utilizing surface landmarks and  AP and lateral fluoroscopy. I scored the cortex with the high-speed drill and then used the hand drill to drill an upward and outward direction into the pedicle. I then tapped line to line. I then placed a 6.5 x 40 mm cortical pedicle screw into the pedicles of L3 bilaterally.    I then turned my attention to the decompression and complete lumbar laminectomies, hemi- facetectomies, and foraminotomies were performed at L3-4.  My nurse practitioner was directly involved in the decompression and exposure of the neural elements. the patient had significant spinal stenosis and this required more work than would be required for a simple exposure of the disc  for posterior lumbar interbody fusion which would only require a limited laminotomy. Much more generous  decompression and generous foraminotomy was undertaken in order to adequately decompress the neural elements and address the patient's leg pain. The yellow ligament was removed to expose the underlying dura and nerve roots, and generous foraminotomies were performed to adequately decompress the neural elements. Both the exiting and traversing nerve roots were decompressed on both sides until a coronary dilator passed easily along the nerve roots. Once the decompression was complete, I turned my attention to the posterior lower lumbar interbody fusion. The epidural venous vasculature was coagulated and cut sharply. Disc space was incised and the initial discectomy was performed with pituitary rongeurs. The disc space was distracted with sequential distractors to a height of 10 mm. We then used a series of scrapers and shavers to prepare the endplates for fusion. The midline was prepared with Epstein curettes. Once the complete discectomy was finished, we packed an appropriate sized interbody cage with local autograft and morcellized allograft, gently retracted the nerve root, and tapped the cage into position at L3-4.  The midline between the cages was packed with morselized autograft and allograft.   I then replaced the left L4 pedicle screw with a 7.5 x 40 mm pedicle screw that had good purchase.  We exposed the transverse process of L3.  We spent considerable time identifying the transverse processes of L4 and L5 as well as the previous intertransverse bone graft that had no evidence of solid arthrodesis between the 2.  We then decorticated the transverse processes and laid a mixture of morcellized autograft and allograft out over these to perform intertransverse arthrodesis at L3-L5 bilaterally. We then placed lordotic rods into the multiaxial screw heads of the pedicle screws and locked these in position with the locking caps and anti-torque device. We then checked our construct with AP and lateral  fluoroscopy. Irrigated with copious amounts of 0.5% povidone iodine solution followed by saline solution. Inspected the nerve roots once again to assure adequate decompression, lined to the dura with Gelfoam,  and then we closed the muscle and the fascia with 0 Vicryl. Closed the subcutaneous tissues with 2-0 Vicryl and subcuticular tissues with 3-0 Vicryl. The skin was closed with benzoin and Steri-Strips. Dressing was then applied, the patient was awakened from general anesthesia and transported to the recovery room in stable condition. At the end of the procedure all sponge, needle and instrument counts were correct.   PLAN OF CARE: admit to inpatient  PATIENT DISPOSITION:  PACU - hemodynamically stable.   Delay start of Pharmacological VTE agent (>24hrs) due to surgical blood loss or risk of bleeding:  yes

## 2022-12-13 DIAGNOSIS — M4726 Other spondylosis with radiculopathy, lumbar region: Secondary | ICD-10-CM | POA: Diagnosis not present

## 2022-12-13 LAB — GLUCOSE, CAPILLARY: Glucose-Capillary: 131 mg/dL — ABNORMAL HIGH (ref 70–99)

## 2022-12-13 MED ORDER — HYDROCODONE-ACETAMINOPHEN 5-325 MG PO TABS: 1.0000 | ORAL_TABLET | ORAL | 0 refills | Status: AC | PRN

## 2022-12-13 MED ORDER — METHOCARBAMOL 750 MG PO TABS: 750.0000 mg | ORAL_TABLET | Freq: Four times a day (QID) | ORAL | 0 refills | Status: AC

## 2022-12-13 NOTE — Evaluation (Signed)
Occupational Therapy Evaluation Patient Details Name: Isabella Stewart MRN: [id number] DOB: 1950-06-20 Today's Date: 12/13/2022   History of Present Illness 72 yo female s/p 11/25 PLIF L3-4 extension fo hardware PMH back surgery significant for DMII, heart murmur, HTN, sleep apnea, back surgery.   Clinical Impression   Patient evaluated by Occupational Therapy with no further acute OT needs identified. All education has been completed and the patient has no further questions. See below for any follow-up Occupational Therapy or equipment needs. OT to sign off. Thank you for referral.         If plan is discharge home, recommend the following: Assist for transportation    Functional Status Assessment  Patient has had a recent decline in their functional status and demonstrates the ability to make significant improvements in function in a reasonable and predictable amount of time.  Equipment Recommendations  None recommended by OT    Recommendations for Other Services       Precautions / Restrictions Precautions Precautions: Back Precaution Comments: handout provided and reviewed Required Braces or Orthoses: Spinal Brace Spinal Brace: Lumbar corset;Applied in sitting position      Mobility Bed Mobility Overal bed mobility: Modified Independent                  Transfers Overall transfer level: Modified independent                        Balance Overall balance assessment: Modified Independent                                         ADL either performed or assessed with clinical judgement   ADL Overall ADL's : Modified independent                                       General ADL Comments: was able to figure 4 cross. pt dressing for home. pt dressing for home. pt able to complete 2 steps holding rail with side stepping.pt complete bathroom transfer and hygiene. sink level grooming task  Back handout provided and  reviewed adls in detail. Pt educated on: clothing between brace, never sleep in brace, set an alarm at night for medication, avoid sitting for long periods of time, correct bed positioning for sleeping, correct sequence for bed mobility, avoiding lifting more than 5 pounds and never wash directly over incision. All education is complete and patient indicates understanding.    Vision Baseline Vision/History: 0 No visual deficits Ability to See in Adequate Light: 0 Adequate Patient Visual Report: No change from baseline       Perception         Praxis         Pertinent Vitals/Pain Pain Assessment Pain Assessment: No/denies pain     Extremity/Trunk Assessment Upper Extremity Assessment Upper Extremity Assessment: Overall WFL for tasks assessed   Lower Extremity Assessment Lower Extremity Assessment: Overall WFL for tasks assessed   Cervical / Trunk Assessment Cervical / Trunk Assessment: Back Surgery   Communication Communication Communication: No apparent difficulties   Cognition Arousal: Alert Behavior During Therapy: WFL for tasks assessed/performed Overall Cognitive Status: Within Functional Limits for tasks assessed  General Comments  incision covered and dressing intact    Exercises     Shoulder Instructions      Home Living Family/patient expects to be discharged to:: Private residence Living Arrangements: Spouse/significant other Available Help at Discharge: Family;Available 24 hours/day Type of Home: House Home Access: Stairs to enter [employer] of Steps: 2 Entrance Stairs-Rails: Left Home Layout: Two level;Able to live on main level with bedroom/bathroom     Bathroom Shower/Tub: Tub/shower unit   Bathroom Toilet: Standard     Home Equipment: Agricultural consultant (2 wheels);BSC/3in1;Shower seat;Hand held shower head;Adaptive equipment Adaptive Equipment: Reacher;Sock aid Additional  Comments: works as Art therapist Prior Level of Function : Independent/Modified Independent                        OT Problem List:        OT Treatment/Interventions:      OT Goals(Current goals can be found in the care plan section)    OT Frequency:      Co-evaluation              AM-PAC OT "6 Clicks" Daily Activity     Outcome Measure Help from another person eating meals?: None Help from another person taking care of personal grooming?: None Help from another person toileting, which includes using toliet, bedpan, or urinal?: None Help from another person bathing (including washing, rinsing, drying)?: None Help from another person to put on and taking off regular upper body clothing?: None Help from another person to put on and taking off regular lower body clothing?: None 6 Click Score: 24   End of Session Equipment Utilized During Treatment: Gait belt;Back brace Nurse Communication: Mobility status;Precautions  Activity Tolerance: Patient tolerated treatment well Patient left: in bed;with call bell/phone within reach;with family/visitor present  OT Visit Diagnosis: Unsteadiness on feet (R26.81)                Time: 1610-9604 OT Time Calculation (min): 31 min Charges:  OT General Charges $OT Visit: 1 Visit OT Evaluation $OT Eval Moderate Complexity: 1 Mod OT Treatments $Self Care/Home Management : 8-22 mins   [name], OTR/L  Acute Rehabilitation Services Office: [phone] .   Mateo Flow 12/13/2022, 1:07 PM

## 2022-12-13 NOTE — Plan of Care (Signed)
Pt doing well. Pt and husband given D/C instructions with verbal understanding. Rx's were sent to the pharmacy by MD. Pt's incision is clean and dry with no sign of infection. Pt's IV was removed prior to D/C. Pt D/C'd home via wheelchair per MD order. Pt is stable @ D/C and has no other needs at this time. Rema Fendt, RN

## 2022-12-13 NOTE — Discharge Summary (Signed)
Physician Discharge Summary  Patient ID: Isabella Stewart MRN: [id number] DOB/AGE: 06/10/1950 72 y.o.  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 The patient tolerated the procedure well and was taken to the recovery room and then to the floor in stable condition. The hospital course was routine. There were no complications. The wound remained clean dry and intact. Pt had appropriate back soreness. No complaints of leg pain or new N/T/W. The patient remained afebrile with stable vital signs, and tolerated a regular diet. The patient continued to increase activities, and pain was well controlled with oral pain medications.   Consults: None  Significant Diagnostic Studies:  Results for orders placed or performed during the hospital encounter of 12/12/22  Glucose, capillary  Result Value Ref Range   Glucose-Capillary 86 70 - 99 mg/dL  Glucose, capillary  Result Value Ref Range   Glucose-Capillary 93 70 - 99 mg/dL  Glucose, capillary  Result Value Ref Range   Glucose-Capillary 178 (H) 70 - 99 mg/dL  Glucose, capillary  Result Value Ref Range   Glucose-Capillary 135 (H) 70 - 99 mg/dL   Comment 1 Notify RN    Comment 2 Document in Chart   Glucose, capillary  Result Value Ref Range   Glucose-Capillary 131 (H) 70 - 99 mg/dL   Comment 1 Notify RN    Comment 2 Document in Chart     DG Lumbar Spine 2-3 Views  Result Date: 12/12/2022 CLINICAL DATA:  Elective surgery. EXAM: LUMBAR SPINE - 2-3 VIEW COMPARISON:  Preoperative imaging. FINDINGS: Three fluoroscopic spot views of the lumbar spine obtained in the operating room. There are new pedicle screws at L3 with L3-L4 interbody spacer. Previous  lower lumbar hardware. IMPRESSION: Intraoperative fluoroscopy during lumbar surgery. Electronically Signed   By: Narda Rutherford M.D.   On: 12/12/2022 12:00   DG C-Arm 1-60 Min-No Report  Result Date: 12/12/2022 Fluoroscopy was utilized by the requesting physician.  No radiographic interpretation.   DG C-Arm 1-60 Min-No Report  Result Date: 12/12/2022 Fluoroscopy was utilized by the requesting physician.  No radiographic interpretation.   DG C-Arm 1-60 Min-No Report  Result Date: 12/12/2022 Fluoroscopy was utilized by the requesting physician.  No radiographic interpretation.    Antibiotics:  Anti-infectives (From admission, onward)    Start     Dose/Rate Route Frequency Ordered Stop   12/12/22 1600  ceFAZolin (ANCEF) IVPB 2g/100 mL premix        2 g 200 mL/hr over 30 Minutes Intravenous Every 8 hours 12/12/22 1212 12/13/22 0035   12/12/22 0600  ceFAZolin (ANCEF) IVPB 2g/100 mL premix        2 g 200 mL/hr over 30 Minutes Intravenous On call to O.R. 12/12/22 0543 12/12/22 0803       Discharge Exam: Blood pressure (!) 106/51, pulse 83, temperature 98.2 F (36.8 C), temperature source Oral, resp. rate 18, height 5\' 2"  (1.575 m), weight 79.4 kg, SpO2 100%. Neurologic: Grossly normal Ambulating and voiding well incision cdi   Discharge Medications:   Allergies as of 12/13/2022       Reactions   Latex Rash   Premarin [conjugated Estrogens] Rash        Medication List     STOP taking these medications  traMADol 50 MG tablet Commonly known as: ULTRAM       TAKE these medications    aspirin EC 81 MG tablet Take 81 mg by mouth daily.   chlorthalidone 25 MG tablet Commonly known as: HYGROTON Take 25 mg by mouth daily.   fosinopril 40 MG tablet Commonly known as: MONOPRIL Take 80 mg by mouth in the morning and at bedtime.   HYDROcodone-acetaminophen 5-325 MG tablet Commonly known as: NORCO/VICODIN Take 1 tablet by mouth every 4 (four) hours as needed  for moderate pain (pain score 4-6).   metFORMIN 500 MG tablet Commonly known as: GLUCOPHAGE Take 1,000 mg by mouth 2 (two) times daily with a meal.   methocarbamol 750 MG tablet Commonly known as: Robaxin-750 Take 1 tablet (750 mg total) by mouth 4 (four) times daily.   pioglitazone 45 MG tablet Commonly known as: ACTOS Take 45 mg by mouth daily.               Durable Medical Equipment  (From admission, onward)           Start     Ordered   12/12/22 1213  DME Walker rolling  Once       Question:  Patient needs a walker to treat with the following condition  Answer:  S/P lumbar fusion   12/12/22 1212   12/12/22 1213  DME 3 n 1  Once        12/12/22 1212            Disposition: home   Final Dx: PLIF L3-4  Discharge Instructions      Remove dressing in 72 hours   Complete by: As directed    Call MD for:   Complete by: As directed    Call MD for:  difficulty breathing, headache or visual disturbances   Complete by: As directed    Call MD for:  hives   Complete by: As directed    Call MD for:  persistant nausea and vomiting   Complete by: As directed    Call MD for:  redness, tenderness, or signs of infection (pain, swelling, redness, odor or green/yellow discharge around incision site)   Complete by: As directed    Call MD for:  severe uncontrolled pain   Complete by: As directed    Call MD for:  temperature >100.4   Complete by: As directed    Diet - low sodium heart healthy   Complete by: As directed    Driving Restrictions   Complete by: As directed    No driving for 2 weeks, no riding in the car for 1 week   Increase activity slowly   Complete by: As directed    Lifting restrictions   Complete by: As directed    No lifting more than 8 lbs          Signed: Tiana Loft La Shehan 12/13/2022, 7:39 AM
